# Patient Record
Sex: Female | Born: 1970 | Race: White | Hispanic: No
Health system: Western US, Academic
[De-identification: ages and names within clinical notes are randomized; demographics above are authoritative.]

## PROBLEM LIST (undated history)

## (undated) DIAGNOSIS — R112 Nausea with vomiting, unspecified: Secondary | ICD-10-CM

## (undated) DIAGNOSIS — Z9889 Other specified postprocedural states: Secondary | ICD-10-CM

## (undated) DIAGNOSIS — E039 Hypothyroidism, unspecified: Secondary | ICD-10-CM

## (undated) DIAGNOSIS — C801 Malignant (primary) neoplasm, unspecified: Secondary | ICD-10-CM

## (undated) DIAGNOSIS — O09529 Supervision of elderly multigravida, unspecified trimester: Secondary | ICD-10-CM

## (undated) DIAGNOSIS — O24419 Gestational diabetes mellitus in pregnancy, unspecified control: Secondary | ICD-10-CM

## (undated) DIAGNOSIS — C73 Malignant neoplasm of thyroid gland: Secondary | ICD-10-CM

## (undated) HISTORY — PX: PR INDUCED ABORTION DILATION AND CURETTAGE: 59840

## (undated) HISTORY — DX: Malignant neoplasm of thyroid gland: C73

## (undated) HISTORY — DX: Gestational diabetes mellitus in pregnancy, unspecified control: O24.419

## (undated) HISTORY — DX: Malignant (primary) neoplasm, unspecified: C80.1

## (undated) HISTORY — DX: Supervision of elderly multigravida, unspecified trimester: O09.529

## (undated) HISTORY — PX: THYROID SURGERY: SHX805

## (undated) MED ORDER — LEVOTHYROXINE SODIUM 112 MCG OR TABS
ORAL_TABLET | ORAL | 3 refills | Status: AC
Start: 2020-12-13 — End: ?

## (undated) MED ORDER — LEVOTHYROXINE SODIUM 112 MCG OR TABS
ORAL_TABLET | ORAL | 3 refills | Status: AC
Start: 2019-12-22 — End: ?

---

## 2008-10-13 ENCOUNTER — Ambulatory Visit (HOSPITAL_COMMUNITY): Admission: RE | Admit: 2008-10-13 | Discharge: 2008-10-13 | Payer: Self-pay | Admitting: Obstetrics and Gynecology

## 2008-11-23 ENCOUNTER — Encounter (INDEPENDENT_AMBULATORY_CARE_PROVIDER_SITE_OTHER): Payer: Self-pay | Admitting: Interventional Radiology

## 2008-11-23 ENCOUNTER — Ambulatory Visit (HOSPITAL_COMMUNITY): Admission: RE | Admit: 2008-11-23 | Discharge: 2008-11-23 | Payer: Self-pay | Admitting: Surgery

## 2009-04-18 ENCOUNTER — Inpatient Hospital Stay (HOSPITAL_COMMUNITY): Admission: AD | Admit: 2009-04-18 | Discharge: 2009-04-18 | Payer: Self-pay | Admitting: Obstetrics and Gynecology

## 2009-04-30 ENCOUNTER — Inpatient Hospital Stay (HOSPITAL_COMMUNITY): Admission: AD | Admit: 2009-04-30 | Discharge: 2009-05-03 | Payer: Self-pay | Admitting: Obstetrics and Gynecology

## 2009-08-13 HISTORY — PX: PR ANES; THYROIDECTOMY: AN60240

## 2009-12-16 ENCOUNTER — Encounter: Admission: RE | Admit: 2009-12-16 | Discharge: 2009-12-16 | Payer: Self-pay | Admitting: Endocrinology

## 2010-05-11 ENCOUNTER — Ambulatory Visit (HOSPITAL_COMMUNITY): Admission: RE | Admit: 2010-05-11 | Discharge: 2010-05-12 | Payer: Self-pay | Admitting: Surgery

## 2010-05-11 ENCOUNTER — Encounter (INDEPENDENT_AMBULATORY_CARE_PROVIDER_SITE_OTHER): Payer: Self-pay | Admitting: Surgery

## 2010-05-11 ENCOUNTER — Other Ambulatory Visit (HOSPITAL_BASED_OUTPATIENT_CLINIC_OR_DEPARTMENT_OTHER): Payer: Self-pay | Admitting: Endocrinology

## 2010-05-24 ENCOUNTER — Encounter (INDEPENDENT_AMBULATORY_CARE_PROVIDER_SITE_OTHER): Payer: Self-pay | Admitting: Surgery

## 2010-05-24 ENCOUNTER — Observation Stay (HOSPITAL_COMMUNITY): Admission: RE | Admit: 2010-05-24 | Discharge: 2010-05-25 | Payer: Self-pay | Admitting: Surgery

## 2010-06-27 ENCOUNTER — Encounter (HOSPITAL_COMMUNITY)
Admission: RE | Admit: 2010-06-27 | Discharge: 2010-09-12 | Payer: Self-pay | Source: Home / Self Care | Attending: Endocrinology | Admitting: Endocrinology

## 2010-10-24 LAB — HCG, SERUM, QUALITATIVE
Preg, Serum: NEGATIVE
Preg, Serum: NEGATIVE

## 2010-10-26 LAB — DIFFERENTIAL
Basophils Absolute: 0 10*3/uL (ref 0.0–0.1)
Eosinophils Relative: 2 % (ref 0–5)
Lymphocytes Relative: 23 % (ref 12–46)
Neutro Abs: 4.4 10*3/uL (ref 1.7–7.7)
Neutrophils Relative %: 69 % (ref 43–77)

## 2010-10-26 LAB — CBC
HCT: 38.4 % (ref 36.0–46.0)
Hemoglobin: 13.2 g/dL (ref 12.0–15.0)
MCHC: 34.4 g/dL (ref 30.0–36.0)
MCV: 90.2 fL (ref 78.0–100.0)
Platelets: 261 10*3/uL (ref 150–400)
RDW: 12.2 % (ref 11.5–15.5)
WBC: 5.5 10*3/uL (ref 4.0–10.5)
WBC: 6.4 10*3/uL (ref 4.0–10.5)

## 2010-10-26 LAB — URINALYSIS, ROUTINE W REFLEX MICROSCOPIC
Nitrite: NEGATIVE
Specific Gravity, Urine: 1.01 (ref 1.005–1.030)
Urobilinogen, UA: 1 mg/dL (ref 0.0–1.0)
pH: 6 (ref 5.0–8.0)

## 2010-10-26 LAB — CALCIUM
Calcium: 7.7 mg/dL — ABNORMAL LOW (ref 8.4–10.5)
Calcium: 7.7 mg/dL — ABNORMAL LOW (ref 8.4–10.5)

## 2010-10-26 LAB — URINE MICROSCOPIC-ADD ON

## 2010-10-26 LAB — SURGICAL PCR SCREEN: Staphylococcus aureus: POSITIVE — AB

## 2010-10-26 LAB — COMPREHENSIVE METABOLIC PANEL
ALT: 28 U/L (ref 0–35)
AST: 26 U/L (ref 0–37)
Alkaline Phosphatase: 76 U/L (ref 39–117)
CO2: 31 mEq/L (ref 19–32)
Calcium: 8.9 mg/dL (ref 8.4–10.5)
GFR calc Af Amer: >60 mL/min (ref >60)
GFR calc non Af Amer: >60 mL/min (ref >60)
Glucose, Bld: 118 mg/dL — ABNORMAL HIGH (ref 70–99)
Potassium: 3.6 mEq/L (ref 3.5–5.1)
Sodium: 139 mEq/L (ref 135–145)
Total Protein: 7.1 g/dL (ref 6.0–8.3)

## 2010-10-26 LAB — BASIC METABOLIC PANEL
BUN: 10 mg/dL (ref 6–23)
Calcium: 9.1 mg/dL (ref 8.4–10.5)
Creatinine, Ser: 0.64 mg/dL (ref 0.4–1.2)
GFR calc Af Amer: >60 mL/min (ref >60)
GFR calc non Af Amer: >60 mL/min (ref >60)

## 2010-10-26 LAB — PROTIME-INR
INR: 1.02 (ref 0.00–1.49)
Prothrombin Time: 13.6 seconds (ref 11.6–15.2)

## 2010-11-17 LAB — COMPREHENSIVE METABOLIC PANEL
BUN: 5 mg/dL — ABNORMAL LOW (ref 6–23)
Calcium: 7.9 mg/dL — ABNORMAL LOW (ref 8.4–10.5)
Creatinine, Ser: 0.67 mg/dL (ref 0.4–1.2)
Glucose, Bld: 123 mg/dL — ABNORMAL HIGH (ref 70–99)
Total Protein: 4.7 g/dL — ABNORMAL LOW (ref 6.0–8.3)

## 2010-11-17 LAB — CBC
HCT: 28.5 % — ABNORMAL LOW (ref 36.0–46.0)
HCT: 35.8 % — ABNORMAL LOW (ref 36.0–46.0)
Hemoglobin: 9.6 g/dL — ABNORMAL LOW (ref 12.0–15.0)
Hemoglobin: 9.9 g/dL — ABNORMAL LOW (ref 12.0–15.0)
MCHC: 34.7 g/dL (ref 30.0–36.0)
MCV: 95.1 fL (ref 78.0–100.0)
Platelets: 201 10*3/uL (ref 150–400)
RBC: 2.87 MIL/uL — ABNORMAL LOW (ref 3.87–5.11)
RBC: 3.76 MIL/uL — ABNORMAL LOW (ref 3.87–5.11)
RDW: 14.4 % (ref 11.5–15.5)
WBC: 9.5 10*3/uL (ref 4.0–10.5)

## 2010-11-17 LAB — URINE CULTURE
Colony Count: NO GROWTH
Special Requests: NEGATIVE

## 2010-11-17 LAB — LACTATE DEHYDROGENASE: LDH: 197 U/L (ref 94–250)

## 2010-11-17 LAB — URIC ACID: Uric Acid, Serum: 3.6 mg/dL (ref 2.4–7.0)

## 2011-07-17 ENCOUNTER — Other Ambulatory Visit (HOSPITAL_COMMUNITY): Payer: Self-pay | Admitting: Endocrinology

## 2011-07-17 DIAGNOSIS — C73 Malignant neoplasm of thyroid gland: Secondary | ICD-10-CM

## 2011-08-27 ENCOUNTER — Encounter (HOSPITAL_COMMUNITY)
Admission: RE | Admit: 2011-08-27 | Discharge: 2011-08-27 | Disposition: A | Discharge status: Home / Self Care | Payer: 59 | Source: Ambulatory Visit | Attending: Endocrinology | Admitting: Endocrinology

## 2011-08-27 DIAGNOSIS — C73 Malignant neoplasm of thyroid gland: Secondary | ICD-10-CM

## 2011-08-27 MED ORDER — THYROTROPIN ALFA 1.1 MG IM SOLR
0.9000 mg | INTRAMUSCULAR | Status: AC
  Administered 2011-08-27: 0.9 mg via INTRAMUSCULAR
  Filled 2011-08-27: qty 0.9

## 2011-08-28 ENCOUNTER — Encounter (HOSPITAL_COMMUNITY)
Admission: RE | Admit: 2011-08-28 | Discharge: 2011-08-28 | Disposition: A | Discharge status: Home / Self Care | Payer: 59 | Source: Ambulatory Visit | Attending: Endocrinology | Admitting: Endocrinology

## 2011-08-28 DIAGNOSIS — C73 Malignant neoplasm of thyroid gland: Secondary | ICD-10-CM | POA: Insufficient documentation

## 2011-08-28 MED ORDER — THYROTROPIN ALFA 1.1 MG IM SOLR
0.9000 mg | INTRAMUSCULAR | Status: AC
  Administered 2011-08-28: 0.9 mg via INTRAMUSCULAR
  Filled 2011-08-28: qty 0.9

## 2011-08-29 ENCOUNTER — Ambulatory Visit (HOSPITAL_COMMUNITY): Admission: RE | Admit: 2011-08-29 | Payer: Self-pay | Source: Ambulatory Visit

## 2011-08-29 LAB — HCG, SERUM, QUALITATIVE: Preg, Serum: NEGATIVE

## 2011-08-31 ENCOUNTER — Encounter (HOSPITAL_COMMUNITY)
Admission: RE | Admit: 2011-08-31 | Discharge: 2011-08-31 | Disposition: A | Discharge status: Home / Self Care | Payer: 59 | Source: Ambulatory Visit | Attending: Endocrinology | Admitting: Endocrinology

## 2011-08-31 MED ORDER — SODIUM IODIDE I 131 CAPSULE
4.0000 | Freq: Once | INTRAVENOUS | Status: AC | PRN
  Administered 2011-08-31: 4 via ORAL

## 2012-06-13 IMAGING — NM NM [ID] THYROID CANCER METS SP CA TX
1 series · 1 of 1 positions shown · non-contrast
Comparison: None

CLINICAL DATA: Thyroid cancer.  Status post therapy

NUCLEAR MEDICINE R-[facility] POST THERAPY WHOLE BODY SCAN
TECHNIQUE: Standard anterior and posterior localized whole body
planar images obtained 5 to 10 days following therapeutic dose of RIZILE
131.

[(id) static · 1 of 1 slices shown]
[im 1/1]
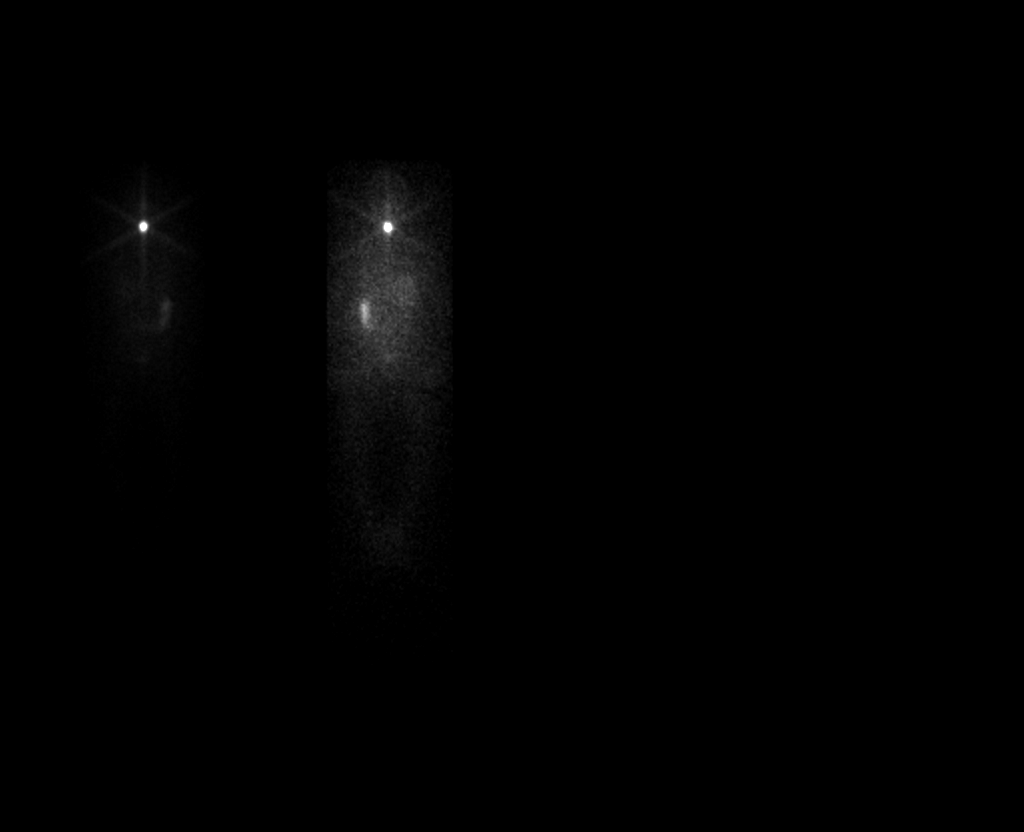

[1 of 1 positions shown; findings below may reference images not displayed]

FINDINGS: There is a medium sized focus of increased radiotracer
uptake within the thyroid bed consistent with residual functioning
thyroid tissue.  Physiologic tracer uptake is identified within the
liver and colon.  There is no abnormal focus of increased uptake to
suggest metastatic disease.
IMPRESSION: 1.  Residual functioning thyroid tissue within the thyroid bed.
2.  No evidence for metastatic disease.

## 2012-08-21 ENCOUNTER — Encounter (HOSPITAL_COMMUNITY): Payer: Self-pay | Admitting: Pharmacist

## 2012-08-21 ENCOUNTER — Encounter (HOSPITAL_COMMUNITY): Payer: Self-pay | Admitting: *Deleted

## 2012-08-22 ENCOUNTER — Ambulatory Visit (HOSPITAL_COMMUNITY): Payer: 59 | Admitting: Anesthesiology

## 2012-08-22 ENCOUNTER — Encounter (HOSPITAL_COMMUNITY): Payer: Self-pay | Admitting: Anesthesiology

## 2012-08-22 ENCOUNTER — Encounter (HOSPITAL_COMMUNITY)
Admission: RE | Disposition: A | Discharge status: Home / Self Care | Payer: Self-pay | Source: Ambulatory Visit | Attending: Obstetrics and Gynecology

## 2012-08-22 ENCOUNTER — Encounter (HOSPITAL_COMMUNITY): Payer: Self-pay | Admitting: *Deleted

## 2012-08-22 ENCOUNTER — Ambulatory Visit (HOSPITAL_COMMUNITY)
Admission: RE | Admit: 2012-08-22 | Discharge: 2012-08-22 | Disposition: A | Discharge status: Home / Self Care | Payer: 59 | Source: Ambulatory Visit | Attending: Obstetrics and Gynecology | Admitting: Obstetrics and Gynecology

## 2012-08-22 DIAGNOSIS — O034 Incomplete spontaneous abortion without complication: Secondary | ICD-10-CM | POA: Diagnosis present

## 2012-08-22 DIAGNOSIS — O021 Missed abortion: Secondary | ICD-10-CM | POA: Insufficient documentation

## 2012-08-22 DIAGNOSIS — Z9889 Other specified postprocedural states: Secondary | ICD-10-CM

## 2012-08-22 HISTORY — DX: Other specified postprocedural states: R11.2

## 2012-08-22 HISTORY — DX: Hypothyroidism, unspecified: E03.9

## 2012-08-22 HISTORY — PX: DILATION AND EVACUATION: SHX1459

## 2012-08-22 HISTORY — DX: Other specified postprocedural states: Z98.890

## 2012-08-22 LAB — CBC
HCT: 33.2 % — ABNORMAL LOW (ref 36.0–46.0)
MCH: 30.7 pg (ref 26.0–34.0)
MCV: 88.8 fL (ref 78.0–100.0)
Platelets: 244 10*3/uL (ref 150–400)
RBC: 3.74 MIL/uL — ABNORMAL LOW (ref 3.87–5.11)

## 2012-08-22 SURGERY — DILATION AND EVACUATION, UTERUS
Anesthesia: Monitor Anesthesia Care | Site: Vagina | Wound class: Clean Contaminated

## 2012-08-22 MED ORDER — DEXAMETHASONE SODIUM PHOSPHATE 10 MG/ML IJ SOLN
INTRAMUSCULAR | Status: DC | PRN
  Administered 2012-08-22: 10 mg via INTRAVENOUS

## 2012-08-22 MED ORDER — OXYCODONE-ACETAMINOPHEN 7.5-500 MG PO TABS: 1.0000 | ORAL_TABLET | ORAL | Status: DC | PRN

## 2012-08-22 MED ORDER — ONDANSETRON HCL 4 MG/2ML IJ SOLN
INTRAMUSCULAR | Status: DC | PRN
  Administered 2012-08-22: 4 mg via INTRAVENOUS

## 2012-08-22 MED ORDER — CHLOROPROCAINE HCL 1 % IJ SOLN
INTRAMUSCULAR | Status: DC | PRN
  Administered 2012-08-22: 17 mL

## 2012-08-22 MED ORDER — LIDOCAINE HCL (CARDIAC) 20 MG/ML IV SOLN
INTRAVENOUS | Status: DC | PRN
  Administered 2012-08-22: 50 mg via INTRAVENOUS

## 2012-08-22 MED ORDER — FENTANYL CITRATE 0.05 MG/ML IJ SOLN
INTRAMUSCULAR | Status: AC
  Filled 2012-08-22: qty 2

## 2012-08-22 MED ORDER — CEFAZOLIN SODIUM-DEXTROSE 2-3 GM-% IV SOLR
INTRAVENOUS | Status: AC
  Filled 2012-08-22: qty 50

## 2012-08-22 MED ORDER — CHLOROPROCAINE HCL 1 % IJ SOLN
INTRAMUSCULAR | Status: AC
  Filled 2012-08-22: qty 30

## 2012-08-22 MED ORDER — MIDAZOLAM HCL 5 MG/5ML IJ SOLN
INTRAMUSCULAR | Status: DC | PRN
  Administered 2012-08-22: 2 mg via INTRAVENOUS

## 2012-08-22 MED ORDER — KETOROLAC TROMETHAMINE 30 MG/ML IJ SOLN
INTRAMUSCULAR | Status: AC
  Filled 2012-08-22: qty 1

## 2012-08-22 MED ORDER — KETOROLAC TROMETHAMINE 30 MG/ML IJ SOLN
INTRAMUSCULAR | Status: DC | PRN
  Administered 2012-08-22: 30 mg via INTRAVENOUS

## 2012-08-22 MED ORDER — FENTANYL CITRATE 0.05 MG/ML IJ SOLN: 25.0000 ug | INTRAMUSCULAR | Status: DC | PRN

## 2012-08-22 MED ORDER — LACTATED RINGERS IV SOLN
INTRAVENOUS | Status: DC
  Administered 2012-08-22: 10:00:00 via INTRAVENOUS
  Administered 2012-08-22: 50 mL/h via INTRAVENOUS

## 2012-08-22 MED ORDER — METOCLOPRAMIDE HCL 5 MG/ML IJ SOLN: 10.0000 mg | Freq: Once | INTRAMUSCULAR | Status: DC | PRN

## 2012-08-22 MED ORDER — CEFAZOLIN SODIUM-DEXTROSE 2-3 GM-% IV SOLR
2.0000 g | INTRAVENOUS | Status: AC
  Administered 2012-08-22: 2 g via INTRAVENOUS

## 2012-08-22 MED ORDER — PROPOFOL 10 MG/ML IV EMUL
INTRAVENOUS | Status: DC | PRN
  Administered 2012-08-22 (×2): 20 mg via INTRAVENOUS

## 2012-08-22 MED ORDER — SCOPOLAMINE 1 MG/3DAYS TD PT72
MEDICATED_PATCH | TRANSDERMAL | Status: AC
  Administered 2012-08-22: 1.5 mg
  Filled 2012-08-22: qty 1

## 2012-08-22 MED ORDER — FENTANYL CITRATE 0.05 MG/ML IJ SOLN
INTRAMUSCULAR | Status: DC | PRN
  Administered 2012-08-22: 50 ug via INTRAVENOUS
  Administered 2012-08-22: 100 ug via INTRAVENOUS
  Administered 2012-08-22: 50 ug via INTRAVENOUS

## 2012-08-22 MED ORDER — ONDANSETRON HCL 4 MG/2ML IJ SOLN
INTRAMUSCULAR | Status: AC
  Filled 2012-08-22: qty 2

## 2012-08-22 MED ORDER — PROPOFOL 10 MG/ML IV EMUL
INTRAVENOUS | Status: AC
  Filled 2012-08-22: qty 20

## 2012-08-22 MED ORDER — KETOROLAC TROMETHAMINE 30 MG/ML IJ SOLN: 15.0000 mg | Freq: Once | INTRAMUSCULAR | Status: DC | PRN

## 2012-08-22 MED ORDER — MIDAZOLAM HCL 2 MG/2ML IJ SOLN
INTRAMUSCULAR | Status: AC
  Filled 2012-08-22: qty 2

## 2012-08-22 MED ORDER — LIDOCAINE HCL (CARDIAC) 20 MG/ML IV SOLN
INTRAVENOUS | Status: AC
  Filled 2012-08-22: qty 5

## 2012-08-22 SURGICAL SUPPLY — 17 items
CLOTH BEACON ORANGE TIMEOUT ST (SAFETY) ×2 IMPLANT
DECANTER SPIKE VIAL GLASS SM (MISCELLANEOUS) ×2 IMPLANT
GLOVE BIO SURGEON STRL SZ7 (GLOVE) ×2 IMPLANT
GOWN STRL REIN XL XLG (GOWN DISPOSABLE) ×4 IMPLANT
KIT BERKELEY 1ST TRIMESTER 3/8 (MISCELLANEOUS) ×2 IMPLANT
NEEDLE SPNL 20GX3.5 QUINCKE YW (NEEDLE) ×2 IMPLANT
NS IRRIG 1000ML POUR BTL (IV SOLUTION) ×2 IMPLANT
PACK VAGINAL MINOR WOMEN LF (CUSTOM PROCEDURE TRAY) ×2 IMPLANT
PAD OB MATERNITY 4.3X12.25 (PERSONAL CARE ITEMS) ×2 IMPLANT
PAD PREP 24X48 CUFFED NSTRL (MISCELLANEOUS) ×2 IMPLANT
SET BERKELEY SUCTION TUBING (SUCTIONS) ×2 IMPLANT
SYR CONTROL 10ML LL (SYRINGE) ×2 IMPLANT
TOWEL OR 17X24 6PK STRL BLUE (TOWEL DISPOSABLE) ×4 IMPLANT
VACURETTE 10 RIGID CVD (CANNULA) IMPLANT
VACURETTE 7MM CVD STRL WRAP (CANNULA) IMPLANT
VACURETTE 8 RIGID CVD (CANNULA) ×2 IMPLANT
VACURETTE 9 RIGID CVD (CANNULA) IMPLANT

## 2012-08-22 NOTE — Op Note (Signed)
NAME:  Wendy Stark, Wendy Stark NO.:  [id number]  MEDICAL RECORD NO.:  [id number]  LOCATION:  [facility]                          FACILITY:  [facility]  PHYSICIAN:  Juluis Mire, M.D.   DATE OF BIRTH:  [birth year]  DATE OF PROCEDURE: DATE OF DISCHARGE:                              OPERATIVE REPORT   PREOPERATIVE DIAGNOSIS:  Nonviable first trimester pregnancy.  POSTOPERATIVE DIAGNOSIS:  Nonviable first trimester pregnancy.  PROCEDURE:  Paracervical block, cervical dilatation with uterine evacuation.  SURGEON:  Juluis Mire, MD  ANESTHESIA:  Paracervical block and sedation.  BLOOD LOSS:  Minimal.  PACKS:  None.  DRAINS:  None.  INTRAOPERATIVE BLOOD PLACED:  None.  COMPLICATIONS:  None.  INDICATION:  Dictated in history and physical.  PROCEDURE IN DETAIL:  The patient was taken to the OR, placed supine position.  After sedation, she was placed in the dorsal lithotomy position using the Allen stirrups.  The patient was draped in sterile field.  A speculum was placed in vaginal vault.  The cervix and vagina were cleansed with Betadine.  Anterior lip of the cervix was anesthetized with 1% Xylocaine and secured with a single-tooth tenaculum.  Uterus sounded to approximately 9 cm, was posterior.  Cervix was serially dilated to a size 27 Pratt dilator.  A size 8 curved suction curette was introduced into the intrauterine cavity.  Contents were removed using suction curetting, this was done 3 times.  On the third pass, there were no additional tissue being obtained.  We did sharp curetting followed by repeat suction curetting and repeat sharp curetting.  We felt all the uterine cavity was cleared by its gritty feel.  The uterus was contracting down well at this point in time.  Bleeding was minimal.  Speculum, single-tooth tenaculum were then removed.  The patient was taken out of the dorsal lithotomy position, once alert, transferred to recovery room in good condition.   Sponge, instrument, and needle count was correct by circulating nurse x2.     Juluis Mire, M.D.     JSM/MEDQ  D:  08/22/2012  T:  08/22/2012  Job:  [id number]

## 2012-08-22 NOTE — Op Note (Signed)
Patient name  Wendy, Stark DICTATION#  [id number] CSN# [id number]  Juluis Mire, MD 08/22/2012 9:22 AM

## 2012-08-22 NOTE — H&P (Signed)
  History and physical exam unchanged 

## 2012-08-22 NOTE — Brief Op Note (Signed)
08/22/2012  9:21 AM  PATIENT:  Dione Housekeeper  42 y.o. female  PRE-OPERATIVE DIAGNOSIS:  missed ab CPT [id number]  POST-OPERATIVE DIAGNOSIS:  missed abortion  PROCEDURE:  Procedure(s) (LRB) with comments: DILATATION AND EVACUATION (N/A)  SURGEON:  Surgeon(s) and Role:    * Juluis Mire, MD - Primary  PHYSICIAN ASSISTANT:   ASSISTANTS: none   ANESTHESIA:   IV sedation and paracervical block  EBL:  Total I/O In: 500 [I.V.:500] Out: 25 [Blood:25]  BLOOD ADMINISTERED:none  DRAINS: none   LOCAL MEDICATIONS USED:  OTHER nesicaine 1 %  SPECIMEN:  Source of Specimen:  products of conception  DISPOSITION OF SPECIMEN:  PATHOLOGY  COUNTS:  YES  TOURNIQUET:  * No tourniquets in log *  DICTATION: .Other Dictation: Dictation Number [id number]  PLAN OF CARE: Discharge to home after PACU  PATIENT DISPOSITION:  PACU - hemodynamically stable.   Delay start of Pharmacological VTE agent (>24hrs) due to surgical blood loss or risk of bleeding: not applicable

## 2012-08-22 NOTE — Transfer of Care (Signed)
Immediate Anesthesia Transfer of Care Note  Patient: Wendy Stark  Procedure(s) Performed: Procedure(s) (LRB) with comments: DILATATION AND EVACUATION (N/A)  Patient Location: PACU  Anesthesia Type:General  Level of Consciousness: awake  Airway & Oxygen Therapy: Patient Spontanous Breathing  Post-op Assessment: Report given to PACU RN  Post vital signs: stable  Filed Vitals:   08/22/12 0801  BP: 111/66  Pulse: 83  Temp: 36.7 C  Resp: 18    Complications: No apparent anesthesia complications

## 2012-08-22 NOTE — H&P (Signed)
Wendy Stark is an 42 y.o. female. Presenting for dilation and evacuation for nonviable first trimester pregnancy.  Sonograms last week reveal gestation sac less than dates without fetal pole or yolk sac.  Follow up sonogram this week revealed no change and increased sac irregularity.  Findings consistent with non viable pregnancy.  Blood type  O+  Pertinent Gynecological History: Menses: regular every month without intermenstrual spotting Bleeding: normal;ception: none DES exposure: denies Blood transfusions: none Sexually transmitted diseases: no past history Previous GYN Procedures: none  Last mammogram: na Date: naast pap: normal Date: last yearry: G2 P1  Menstrual History: Menarche age: 3No LMP recorded.    Past Medical History  Diagnosis Date  . PONV (postoperative nausea and vomiting)   . Hypothyroidism     Past Surgical History  Procedure Date  . Thyroid surgery     History reviewed. No pertinent family history.  Social History:  reports that she has never smoked. She does not have any smokeless tobacco history on file. She reports that she does not drink alcohol or use illicit drugs.  Allergies: No Known Allergies  No prescriptions prior to admission    Review of Systems  All other systems reviewed and are negative.    There were no vitals taken for this visit. Physical Exam  Constitutional: She is oriented to person, place, and time. She appears well-developed and well-nourished.  HENT:  Head: Normocephalic and atraumatic.  Eyes: Pupils are equal, round, and reactive to light.  Cardiovascular: Normal rate, regular rhythm and normal heart sounds.   Respiratory: Effort normal and breath sounds normal.  GI: Soft. Bowel sounds are normal.  Genitourinary:       Uterus 8 weeks is size adnexa clear   Neurological: She is alert and oriented to person, place, and time. She has normal reflexes.    No results found for this or any previous visit (from the past  24 hour(s)).  No results found.  Assessment/Plan: Nonviable first trimester pregnancy Precede with dilation and evacuation.  Risks discussed and alternatives explained.  Risks include.  Infection.  Hemorrhage with possible need for transfusions with associate risk of aids and hepatitis.  Excessive bleeding could require hysterectomy.  Risk of retained products that could require repeat procedure. Risk of perforation with injury to adjacent organs requiring exploratory surgery.  Risk of DVT and PE.  Alternative would be following to see if she would abort on her own.  Patient understands nature of the procedure, risks and alternatives   Wendy Stark S 08/22/2012, 6:57 AM

## 2012-08-22 NOTE — Anesthesia Postprocedure Evaluation (Signed)
Anesthesia Post Note  Patient: Wendy Stark  Procedure(s) Performed: Procedure(s) (LRB): DILATATION AND EVACUATION (N/A)  Anesthesia type: MAC  Patient location: PACU  Post pain: Pain level controlled  Post assessment: Post-op Vital signs reviewed  Last Vitals:  Filed Vitals:   08/22/12 1015  BP: 110/61  Pulse: 69  Temp:   Resp: 20    Post vital signs: Reviewed  Level of consciousness: sedated  Complications: No apparent anesthesia complications

## 2012-08-22 NOTE — Anesthesia Preprocedure Evaluation (Signed)
Anesthesia Evaluation  Patient identified by MRN, date of birth, ID band Patient awake    Reviewed: Allergy & Precautions, H&P , NPO status , Patient's Chart, lab work & pertinent test results, reviewed documented beta blocker date and time   History of Anesthesia Complications (+) PONV  Airway Mallampati: II TM Distance: >3 FB Neck ROM: full    Dental  (+) Teeth Intact   Pulmonary neg pulmonary ROS,  breath sounds clear to auscultation        Cardiovascular negative cardio ROS  Rhythm:regular Rate:Normal     Neuro/Psych negative neurological ROS  negative psych ROS   GI/Hepatic negative GI ROS, Neg liver ROS,   Endo/Other  Hypothyroidism (s/p thyroidectomy)   Renal/GU negative Renal ROS  negative genitourinary   Musculoskeletal   Abdominal   Peds  Hematology negative hematology ROS (+)   Anesthesia Other Findings   Reproductive/Obstetrics (+) Pregnancy (missed ab)                           Anesthesia Physical Anesthesia Plan  ASA: II  Anesthesia Plan: MAC   Post-op Pain Management:    Induction:   Airway Management Planned:   Additional Equipment:   Intra-op Plan:   Post-operative Plan:   Informed Consent: I have reviewed the patients History and Physical, chart, labs and discussed the procedure including the risks, benefits and alternatives for the proposed anesthesia with the patient or authorized representative who has indicated his/her understanding and acceptance.   Dental Advisory Given  Plan Discussed with: Surgeon and CRNA  Anesthesia Plan Comments:         Anesthesia Quick Evaluation

## 2012-08-25 ENCOUNTER — Encounter (HOSPITAL_COMMUNITY): Payer: Self-pay | Admitting: Obstetrics and Gynecology

## 2013-03-20 LAB — OB RESULTS CONSOLE GC/CHLAMYDIA
CHLAMYDIA, DNA PROBE: NEGATIVE
GC PROBE AMP, GENITAL: NEGATIVE

## 2013-03-20 LAB — OB RESULTS CONSOLE RUBELLA ANTIBODY, IGM: RUBELLA: IMMUNE

## 2013-03-20 LAB — OB RESULTS CONSOLE HEPATITIS B SURFACE ANTIGEN: Hepatitis B Surface Ag: NEGATIVE

## 2013-03-20 LAB — OB RESULTS CONSOLE ABO/RH: RH Type: POSITIVE

## 2013-03-20 LAB — OB RESULTS CONSOLE RPR: RPR: NONREACTIVE

## 2013-03-20 LAB — OB RESULTS CONSOLE ANTIBODY SCREEN: ANTIBODY SCREEN: NEGATIVE

## 2013-03-20 LAB — OB RESULTS CONSOLE HIV ANTIBODY (ROUTINE TESTING): HIV: NONREACTIVE

## 2013-05-28 ENCOUNTER — Other Ambulatory Visit (HOSPITAL_COMMUNITY): Payer: Self-pay | Admitting: Internal Medicine

## 2013-05-28 DIAGNOSIS — R1011 Right upper quadrant pain: Secondary | ICD-10-CM

## 2013-06-01 ENCOUNTER — Ambulatory Visit (HOSPITAL_COMMUNITY)
Admission: RE | Admit: 2013-06-01 | Discharge: 2013-06-01 | Disposition: A | Discharge status: Home / Self Care | Payer: 59 | Source: Ambulatory Visit | Attending: Internal Medicine | Admitting: Internal Medicine

## 2013-06-01 DIAGNOSIS — R1011 Right upper quadrant pain: Secondary | ICD-10-CM | POA: Insufficient documentation

## 2013-06-01 DIAGNOSIS — O99891 Other specified diseases and conditions complicating pregnancy: Secondary | ICD-10-CM | POA: Insufficient documentation

## 2013-08-13 NOTE — L&D Delivery Note (Signed)
Delivery Note At 11:37 AM a viable female was delivered via Vaginal, Spontaneous Delivery (Presentation: Left Occiput Anterior).  APGAR:8/9 , ; weight .pending   Placenta status:intact , .3 vessel  Cord:  with the following complications:none .  Cord pH: na  Anesthesia: Local  Episiotomy: none Lacerations:teft labial  Suture Repair: 2.0 chromic Est. Blood Loss (mL):   Mom to postpartum.  Baby to Couplet care / Skin to Skin.  Ridhima Golberg S 10/20/2013, 11:54 AM

## 2013-08-19 ENCOUNTER — Encounter: Payer: 59 | Attending: Obstetrics and Gynecology

## 2013-08-19 VITALS — Ht 63.0 in | Wt 124.0 lb

## 2013-08-19 DIAGNOSIS — O99810 Abnormal glucose complicating pregnancy: Secondary | ICD-10-CM | POA: Insufficient documentation

## 2013-08-19 DIAGNOSIS — Z713 Dietary counseling and surveillance: Secondary | ICD-10-CM | POA: Insufficient documentation

## 2013-08-19 NOTE — Progress Notes (Signed)
  Patient was seen on 08/19/13 for Gestational Diabetes self-management class at the [facility]. The following learning objectives were met by the patient during this course:   States the definition of Gestational Diabetes  States why dietary management is important in controlling blood glucose  Describes the effects of carbohydrates on blood glucose levels  Demonstrates ability to create a balanced meal plan  Demonstrates carbohydrate counting   States when to check blood glucose levels  Demonstrates proper blood glucose monitoring techniques  States the effect of stress and exercise on blood glucose levels  States the importance of limiting caffeine and abstaining from alcohol and smoking  Plan:  Aim for 2 Carb Choices per meal (30 grams) +/- 1 either way for breakfast Aim for 3 Carb Choices per meal (45 grams) +/- 1 either way from lunch and dinner Aim for 1-2 Carbs per snack Begin reading food labels for Total Carbohydrate and sugar grams of foods Consider  increasing your activity level by walking daily as tolerated Begin checking BG before breakfast and 1-2 hours after first bit of breakfast, lunch and dinner after  as directed by MD  Take medication  as directed by MD  Blood glucose monitor given: One Touch Ultra Mini Lot # [id number] X Exp: 01/2014 Blood glucose reading: 139m/dl  Patient instructed to monitor glucose levels: FBS: 60 - <90 1 hour: <140 2 hour: <120  Patient received the following handouts:  Nutrition Diabetes and Pregnancy  Carbohydrate Counting List  Meal Planning worksheet  Patient will be seen for follow-up as needed.

## 2013-10-09 ENCOUNTER — Encounter (HOSPITAL_COMMUNITY): Payer: Self-pay | Admitting: *Deleted

## 2013-10-09 ENCOUNTER — Telehealth (HOSPITAL_COMMUNITY): Payer: Self-pay | Admitting: *Deleted

## 2013-10-09 LAB — OB RESULTS CONSOLE GBS: STREP GROUP B AG: NEGATIVE

## 2013-10-09 NOTE — Telephone Encounter (Signed)
Preadmission screen  

## 2013-10-20 ENCOUNTER — Encounter (HOSPITAL_COMMUNITY): Payer: Self-pay

## 2013-10-20 ENCOUNTER — Inpatient Hospital Stay (HOSPITAL_COMMUNITY)
Admission: RE | Admit: 2013-10-20 | Discharge: 2013-10-22 | DRG: 775 | Disposition: A | Discharge status: Home / Self Care | Payer: 59 | Source: Ambulatory Visit | Attending: Obstetrics and Gynecology | Admitting: Obstetrics and Gynecology

## 2013-10-20 DIAGNOSIS — O24419 Gestational diabetes mellitus in pregnancy, unspecified control: Secondary | ICD-10-CM | POA: Diagnosis present

## 2013-10-20 DIAGNOSIS — O99284 Endocrine, nutritional and metabolic diseases complicating childbirth: Secondary | ICD-10-CM

## 2013-10-20 DIAGNOSIS — E039 Hypothyroidism, unspecified: Secondary | ICD-10-CM | POA: Diagnosis present

## 2013-10-20 DIAGNOSIS — O99814 Abnormal glucose complicating childbirth: Secondary | ICD-10-CM | POA: Diagnosis present

## 2013-10-20 DIAGNOSIS — O09529 Supervision of elderly multigravida, unspecified trimester: Secondary | ICD-10-CM | POA: Diagnosis present

## 2013-10-20 DIAGNOSIS — E079 Disorder of thyroid, unspecified: Secondary | ICD-10-CM | POA: Diagnosis present

## 2013-10-20 LAB — CBC
HCT: 35.7 % — ABNORMAL LOW (ref 36.0–46.0)
Hemoglobin: 12.3 g/dL (ref 12.0–15.0)
MCH: 30.8 pg (ref 26.0–34.0)
MCHC: 34.5 g/dL (ref 30.0–36.0)
MCV: 89.3 fL (ref 78.0–100.0)
PLATELETS: 177 10*3/uL (ref 150–400)
RBC: 4 MIL/uL (ref 3.87–5.11)
RDW: 14.7 % (ref 11.5–15.5)
WBC: 6.6 10*3/uL (ref 4.0–10.5)

## 2013-10-20 LAB — TYPE AND SCREEN
ABO/RH(D): O POS
Antibody Screen: NEGATIVE

## 2013-10-20 LAB — RPR: RPR Ser Ql: NONREACTIVE

## 2013-10-20 LAB — ABO/RH: ABO/RH(D): O POS

## 2013-10-20 MED ORDER — LEVOTHYROXINE SODIUM 112 MCG PO TABS
112.0000 ug | ORAL_TABLET | Freq: Every day | ORAL | Status: DC
  Administered 2013-10-22: 112 ug via ORAL
  Filled 2013-10-20 (×3): qty 1

## 2013-10-20 MED ORDER — LACTATED RINGERS IV SOLN
INTRAVENOUS | Status: DC
  Administered 2013-10-20: 09:00:00 via INTRAVENOUS

## 2013-10-20 MED ORDER — ZOLPIDEM TARTRATE 5 MG PO TABS: 5.0000 mg | ORAL_TABLET | Freq: Every evening | ORAL | Status: DC | PRN

## 2013-10-20 MED ORDER — TETANUS-DIPHTH-ACELL PERTUSSIS 5-2.5-18.5 LF-MCG/0.5 IM SUSP: 0.5000 mL | Freq: Once | INTRAMUSCULAR | Status: DC

## 2013-10-20 MED ORDER — OXYCODONE-ACETAMINOPHEN 5-325 MG PO TABS
1.0000 | ORAL_TABLET | ORAL | Status: DC | PRN
  Filled 2013-10-20: qty 2

## 2013-10-20 MED ORDER — TERBUTALINE SULFATE 1 MG/ML IJ SOLN: 0.2500 mg | Freq: Once | INTRAMUSCULAR | Status: DC | PRN

## 2013-10-20 MED ORDER — LIDOCAINE HCL (PF) 1 % IJ SOLN
30.0000 mL | INTRAMUSCULAR | Status: DC | PRN
  Administered 2013-10-20: 30 mL via SUBCUTANEOUS
  Filled 2013-10-20: qty 30

## 2013-10-20 MED ORDER — ONDANSETRON HCL 4 MG PO TABS: 4.0000 mg | ORAL_TABLET | ORAL | Status: DC | PRN

## 2013-10-20 MED ORDER — SIMETHICONE 80 MG PO CHEW: 80.0000 mg | CHEWABLE_TABLET | ORAL | Status: DC | PRN

## 2013-10-20 MED ORDER — SENNOSIDES-DOCUSATE SODIUM 8.6-50 MG PO TABS
2.0000 | ORAL_TABLET | ORAL | Status: DC
  Administered 2013-10-21 (×2): 2 via ORAL
  Filled 2013-10-20 (×2): qty 2

## 2013-10-20 MED ORDER — DIPHENHYDRAMINE HCL 25 MG PO CAPS: 25.0000 mg | ORAL_CAPSULE | Freq: Four times a day (QID) | ORAL | Status: DC | PRN

## 2013-10-20 MED ORDER — LANOLIN HYDROUS EX OINT: TOPICAL_OINTMENT | CUTANEOUS | Status: DC | PRN

## 2013-10-20 MED ORDER — CITRIC ACID-SODIUM CITRATE 334-500 MG/5ML PO SOLN: 30.0000 mL | ORAL | Status: DC | PRN

## 2013-10-20 MED ORDER — IBUPROFEN 600 MG PO TABS
600.0000 mg | ORAL_TABLET | Freq: Four times a day (QID) | ORAL | Status: DC
  Administered 2013-10-20 – 2013-10-22 (×8): 600 mg via ORAL
  Filled 2013-10-20 (×8): qty 1

## 2013-10-20 MED ORDER — ONDANSETRON HCL 4 MG/2ML IJ SOLN: 4.0000 mg | INTRAMUSCULAR | Status: DC | PRN

## 2013-10-20 MED ORDER — DIBUCAINE 1 % RE OINT
1.0000 "application " | TOPICAL_OINTMENT | RECTAL | Status: DC | PRN
  Filled 2013-10-20: qty 28

## 2013-10-20 MED ORDER — BENZOCAINE-MENTHOL 20-0.5 % EX AERO
1.0000 "application " | INHALATION_SPRAY | CUTANEOUS | Status: DC | PRN
  Filled 2013-10-20: qty 56

## 2013-10-20 MED ORDER — OXYTOCIN 40 UNITS IN LACTATED RINGERS INFUSION - SIMPLE MED
62.5000 mL/h | INTRAVENOUS | Status: DC
  Administered 2013-10-20: 62.5 mL/h via INTRAVENOUS
  Filled 2013-10-20: qty 1000

## 2013-10-20 MED ORDER — FLEET ENEMA 7-19 GM/118ML RE ENEM: 1.0000 | ENEMA | RECTAL | Status: DC | PRN

## 2013-10-20 MED ORDER — OXYTOCIN BOLUS FROM INFUSION
500.0000 mL | INTRAVENOUS | Status: DC
  Administered 2013-10-20: 500 mL via INTRAVENOUS

## 2013-10-20 MED ORDER — IBUPROFEN 600 MG PO TABS: 600.0000 mg | ORAL_TABLET | Freq: Four times a day (QID) | ORAL | Status: DC | PRN

## 2013-10-20 MED ORDER — OXYTOCIN 40 UNITS IN LACTATED RINGERS INFUSION - SIMPLE MED: 1.0000 m[IU]/min | INTRAVENOUS | Status: DC

## 2013-10-20 MED ORDER — WITCH HAZEL-GLYCERIN EX PADS: 1.0000 "application " | MEDICATED_PAD | CUTANEOUS | Status: DC | PRN

## 2013-10-20 MED ORDER — LACTATED RINGERS IV SOLN: 500.0000 mL | INTRAVENOUS | Status: DC | PRN

## 2013-10-20 MED ORDER — OXYCODONE-ACETAMINOPHEN 5-325 MG PO TABS
1.0000 | ORAL_TABLET | ORAL | Status: DC | PRN
  Administered 2013-10-20 – 2013-10-21 (×5): 2 via ORAL
  Filled 2013-10-20 (×4): qty 2

## 2013-10-20 MED ORDER — BISACODYL 10 MG RE SUPP: 10.0000 mg | Freq: Every day | RECTAL | Status: DC | PRN

## 2013-10-20 MED ORDER — ACETAMINOPHEN 325 MG PO TABS: 650.0000 mg | ORAL_TABLET | ORAL | Status: DC | PRN

## 2013-10-20 MED ORDER — FLEET ENEMA 7-19 GM/118ML RE ENEM: 1.0000 | ENEMA | Freq: Every day | RECTAL | Status: DC | PRN

## 2013-10-20 MED ORDER — ONDANSETRON HCL 4 MG/2ML IJ SOLN: 4.0000 mg | Freq: Four times a day (QID) | INTRAMUSCULAR | Status: DC | PRN

## 2013-10-20 MED ORDER — PRENATAL MULTIVITAMIN CH
1.0000 | ORAL_TABLET | Freq: Every day | ORAL | Status: DC
  Administered 2013-10-21: 1 via ORAL
  Filled 2013-10-20: qty 1

## 2013-10-20 NOTE — H&P (Signed)
Wendy Stark is a 43 y.o. female presenting at 26.3 for induction .  PNC complicated by ama normal cell free fetal DNA.  Gestational diabetes requiring oral meds.   Negative GBS. Maternal Medical History:  Reason for admission: Induction   Contractions: Frequency: irregular.   Perceived severity is mild.    Fetal activity: Perceived fetal activity is normal.    Prenatal complications: Gestational diabetes requiring glyberide for management  Prenatal Complications - Diabetes: gestational. Diabetes is managed by oral agent (monotherapy).      OB History   Grav Para Term Preterm Abortions TAB SAB Ect Mult Living   3 1 1  1  1   1      Past Medical History  Diagnosis Date  . PONV (postoperative nausea and vomiting)   . Hypothyroidism   . AMA (advanced maternal age) multigravida 31+   . Cancer     papillary carcinoma of thyroid   Past Surgical History  Procedure Laterality Date  . Thyroid surgery    . Dilation and evacuation  08/22/2012    Procedure: DILATATION AND EVACUATION;  Surgeon: Darlyn Chamber, MD;  Location: [facility];  Service: Gynecology;  Laterality: N/A;   Family History: family history includes Alzheimer's disease in her maternal grandmother. Social History:  reports that she has never smoked. She does not have any smokeless tobacco history on file. She reports that she does not drink alcohol or use illicit drugs.   Prenatal Transfer Tool  Maternal Diabetes: Yes:  Diabetes Type:  Insulin/Medication controlled Genetic Screening: Normal Maternal Ultrasounds/Referrals: Normal Fetal Ultrasounds or other Referrals:  None Maternal Substance Abuse:  No Significant Maternal Medications:  glyberide Significant Maternal Lab Results:  None Other Comments:  None  ROS    Height 5\' 3"  (1.6 m), weight 58.514 kg (129 lb), last menstrual period 01/17/2013. Maternal Exam:  Uterine Assessment: Contraction strength is mild.  Contraction frequency is irregular.   Abdomen:  Patient reports no abdominal tenderness. Fundal height is c/wdates.   Estimated fetal weight is 7.   Fetal presentation: vertex  Introitus: Amniotic fluid character: clear.  Cervix: Cervix 3-4 cm   50 %  vtx at -1  Fetal Exam Fetal State Assessment: Category I - tracings are normal.     Physical Exam  Prenatal labs: ABO, Rh: O/Positive/-- (08/08 0000) Antibody: Negative (08/08 0000) Rubella: Immune (08/08 0000) RPR: Nonreactive (08/08 0000)  HBsAg: Negative (08/08 0000)  HIV: Non-reactive (08/08 0000)  GBS: Negative (02/27 0000)   Assessment/Plan: IUP at 39.3 with gestational diabets requiring oral medication AMA Induction risk of pitocin discussed   Akili Corsetti S 10/20/2013, 8:42 AM

## 2013-10-20 NOTE — Lactation Note (Signed)
This note was copied from the chart of [place]. Lactation Consultation Note Initial visit at 9 hours of age.  Mom reports a few feedings, but baby doesn't stay on long.  Admission nursery continues to monitor low blood sugars and baby was previously supplemented with formula and bottle.  Mom is able to demonstrate hand expression with colostrum visible.  Encouraged mom to feed with cues, STS and discussed cluster feeding.  Mom denies pain.  [facility] LC resources given and discussed.  Mom to call for assist as needed.    Patient Name: Wendy Stark [id number] Date: 10/20/2013 Reason for consult: Initial assessment   Maternal Data Has patient been taught Hand Expression?: Yes  Feeding Feeding Type: Breast Fed Length of feed: 5 min  LATCH Score/Interventions Latch: Grasps breast easily, tongue down, lips flanged, rhythmical sucking.  Audible Swallowing: None Intervention(s): Skin to skin  Type of Nipple: Everted at rest and after stimulation  Comfort (Breast/Nipple): Soft / non-tender     Hold (Positioning): No assistance needed to correctly position infant at breast. Intervention(s): Breastfeeding basics reviewed  LATCH Score: 8  Lactation Tools Discussed/Used     Consult Status Consult Status: Follow-up Date: 10/21/13 Follow-up type: In-patient    Justice Britain 10/20/2013, 9:01 PM

## 2013-10-21 LAB — CBC
HCT: 28 % — ABNORMAL LOW (ref 36.0–46.0)
Hemoglobin: 9.8 g/dL — ABNORMAL LOW (ref 12.0–15.0)
MCH: 31.4 pg (ref 26.0–34.0)
MCHC: 35 g/dL (ref 30.0–36.0)
MCV: 89.7 fL (ref 78.0–100.0)
Platelets: 155 10*3/uL (ref 150–400)
RBC: 3.12 MIL/uL — AB (ref 3.87–5.11)
RDW: 14.9 % (ref 11.5–15.5)
WBC: 9.2 10*3/uL (ref 4.0–10.5)

## 2013-10-21 NOTE — Progress Notes (Signed)
This nurse went in to give pt her synthroid medication and pt told nurse she has just taken her medication she had brought from home.  Pt instructed not to take home medication, that her doctor had ordered the medication for her and that we would administer it.  Pt verbalized understanding.  Juanda Chance NP was notified.

## 2013-10-21 NOTE — Progress Notes (Signed)
Post Partum Day 1 Subjective: up ad lib, voiding and tolerating PO  Objective: Blood pressure 107/67, pulse 73, temperature 98.2 F (36.8 C), temperature source Oral, resp. rate 18, height 5\' 3"  (1.6 m), weight 129 lb (58.514 kg), last menstrual period 01/17/2013, unknown if currently breastfeeding.  Physical Exam:  General: alert and cooperative Lochia: appropriate Uterine Fundus: firm Incision: labial edema noted , able to void without difficulty DVT Evaluation: No evidence of DVT seen on physical exam. Negative Homan's sign. No cords or calf tenderness. No significant calf/ankle edema.   Recent Labs  10/20/13 0740 10/21/13 0610  HGB 12.3 9.8*  HCT 35.7* 28.0*    Assessment/Plan: Plan for discharge tomorrow   LOS: 1 day   Wendy Stark G 10/21/2013, 8:10 AM

## 2013-10-22 MED ORDER — OXYCODONE-ACETAMINOPHEN 5-325 MG PO TABS: 1.0000 | ORAL_TABLET | ORAL | Status: AC | PRN

## 2013-10-22 MED ORDER — IBUPROFEN 600 MG PO TABS
600.0000 mg | ORAL_TABLET | Freq: Four times a day (QID) | ORAL | Status: AC
Start: 2013-10-22 — End: ?

## 2013-10-22 NOTE — Discharge Summary (Signed)
Obstetric Discharge Summary Reason for Admission: induction of labor Prenatal Procedures: ultrasound Intrapartum Procedures: spontaneous vaginal delivery Postpartum Procedures: none Complications-Operative and Postpartum: 1 degree perineal laceration Hemoglobin  Date Value Ref Range Status  10/21/2013 9.8* 12.0 - 15.0 g/dL Final     DELTA CHECK NOTED     REPEATED TO VERIFY     HCT  Date Value Ref Range Status  10/21/2013 28.0* 36.0 - 46.0 % Final    Physical Exam:  General: alert and cooperative Lochia: appropriate Uterine Fundus: firm Incision: healing well DVT Evaluation: No evidence of DVT seen on physical exam. Negative Homan's sign. No cords or calf tenderness. No significant calf/ankle edema.  Discharge Diagnoses: Term Pregnancy-delivered  Discharge Information: Date: 10/22/2013 Activity: pelvic rest Diet: routine Medications: PNV, Ibuprofen and Percocet Condition: stable Instructions: refer to practice specific booklet Discharge to: home   Newborn Data: Live born female  Birth Weight: 6 lb 14 oz (3118 g) APGAR: 9, 9  Home with mother.  Wendy Stark G 10/22/2013, 8:13 AM

## 2013-10-24 ENCOUNTER — Inpatient Hospital Stay (HOSPITAL_COMMUNITY): Admission: AD | Admit: 2013-10-24 | Payer: 59 | Source: Ambulatory Visit | Admitting: Obstetrics and Gynecology

## 2014-04-26 ENCOUNTER — Encounter (HOSPITAL_COMMUNITY)
Admission: RE | Admit: 2014-04-26 | Discharge: 2014-04-26 | Disposition: A | Discharge status: Home / Self Care | Payer: 59 | Source: Ambulatory Visit | Attending: Endocrinology | Admitting: Endocrinology

## 2014-04-26 ENCOUNTER — Other Ambulatory Visit (HOSPITAL_COMMUNITY): Payer: Self-pay | Admitting: Endocrinology

## 2014-04-26 DIAGNOSIS — C73 Malignant neoplasm of thyroid gland: Secondary | ICD-10-CM | POA: Diagnosis not present

## 2014-04-26 MED ORDER — THYROTROPIN ALFA 1.1 MG IM SOLR
0.9000 mg | INTRAMUSCULAR | Status: AC
  Administered 2014-04-26: 0.9 mg via INTRAMUSCULAR

## 2014-04-27 ENCOUNTER — Encounter (HOSPITAL_COMMUNITY)
Admission: RE | Admit: 2014-04-27 | Discharge: 2014-04-27 | Disposition: A | Discharge status: Home / Self Care | Payer: 59 | Source: Ambulatory Visit | Attending: Endocrinology | Admitting: Endocrinology

## 2014-04-27 DIAGNOSIS — C73 Malignant neoplasm of thyroid gland: Secondary | ICD-10-CM | POA: Diagnosis not present

## 2014-04-27 MED ORDER — THYROTROPIN ALFA 1.1 MG IM SOLR
0.9000 mg | INTRAMUSCULAR | Status: AC
  Administered 2014-04-27: 0.9 mg via INTRAMUSCULAR

## 2014-04-27 MED ORDER — THYROTROPIN ALFA 1.1 MG IM SOLR: 0.9000 mg | INTRAMUSCULAR | Status: AC

## 2014-04-28 ENCOUNTER — Encounter (HOSPITAL_COMMUNITY): Admission: RE | Admit: 2014-04-28 | Payer: 59 | Source: Ambulatory Visit

## 2014-04-28 DIAGNOSIS — C73 Malignant neoplasm of thyroid gland: Secondary | ICD-10-CM | POA: Diagnosis not present

## 2014-04-28 LAB — HCG, SERUM, QUALITATIVE: PREG SERUM: NEGATIVE

## 2014-04-30 ENCOUNTER — Encounter (HOSPITAL_COMMUNITY)
Admission: RE | Admit: 2014-04-30 | Discharge: 2014-04-30 | Disposition: A | Discharge status: Home / Self Care | Payer: 59 | Source: Ambulatory Visit | Attending: Endocrinology | Admitting: Endocrinology

## 2014-04-30 DIAGNOSIS — C73 Malignant neoplasm of thyroid gland: Secondary | ICD-10-CM | POA: Diagnosis not present

## 2014-04-30 MED ORDER — SODIUM IODIDE I 131 CAPSULE
4.0000 | Freq: Once | INTRAVENOUS | Status: AC | PRN
  Administered 2014-04-29: 4 via ORAL

## 2014-06-14 ENCOUNTER — Encounter (HOSPITAL_COMMUNITY): Payer: Self-pay

## 2015-05-06 IMAGING — US US ABDOMEN COMPLETE
1 series · 14 of 25 positions shown · non-contrast
Comparison: None.

CLINICAL DATA: Right upper quadrant pain.

EXAM:
ULTRASOUND ABDOMEN COMPLETE

[Series 1: us abdomen complete · 14 of 90 slices shown]
[im 1/90]
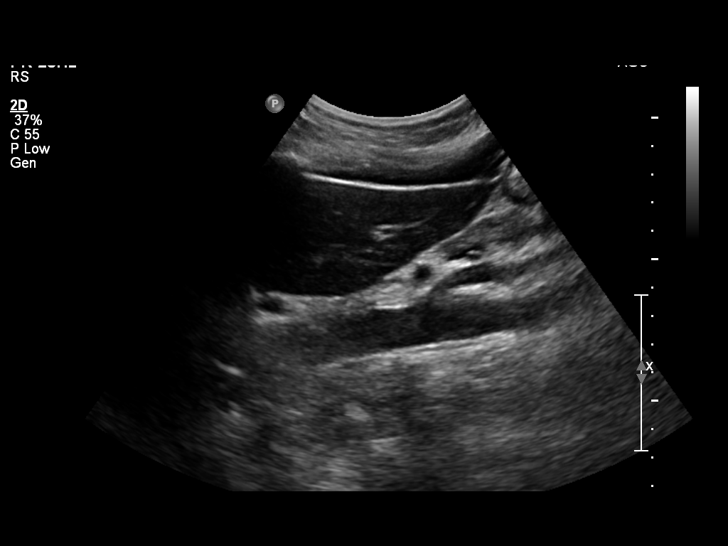
[im 8/90]
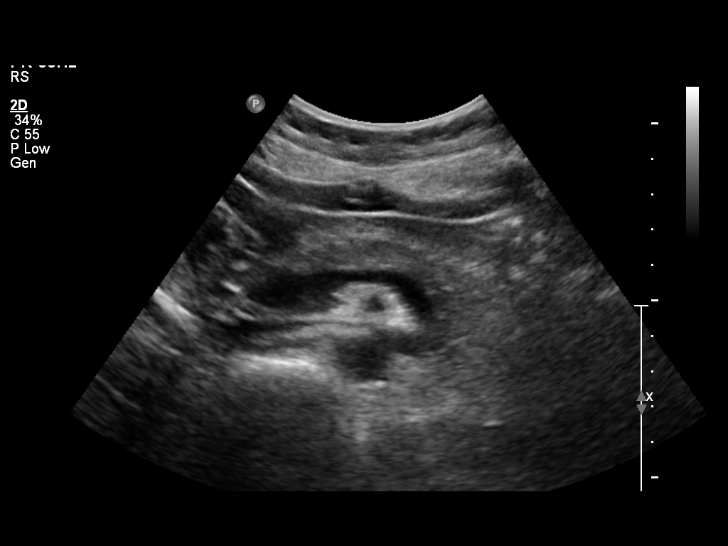
[im 15/90]
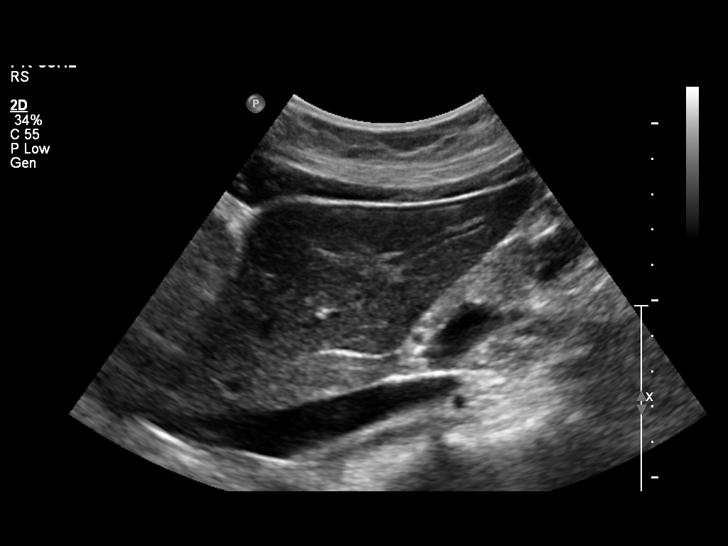
[im 23/90]
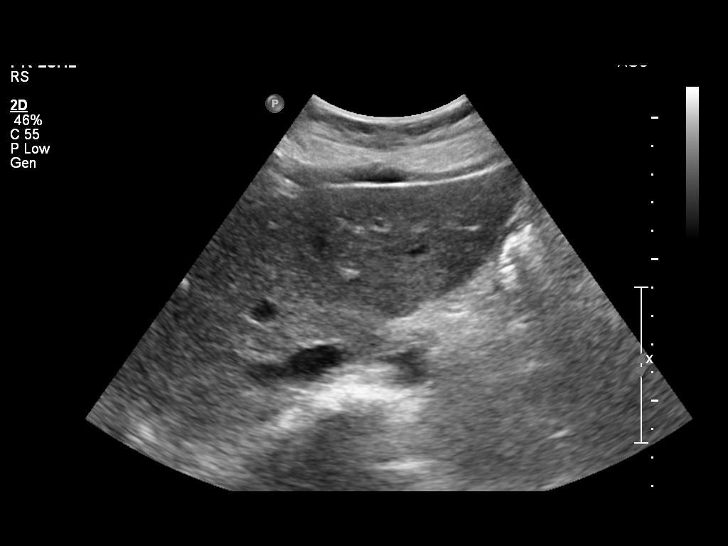
[im 30/90]
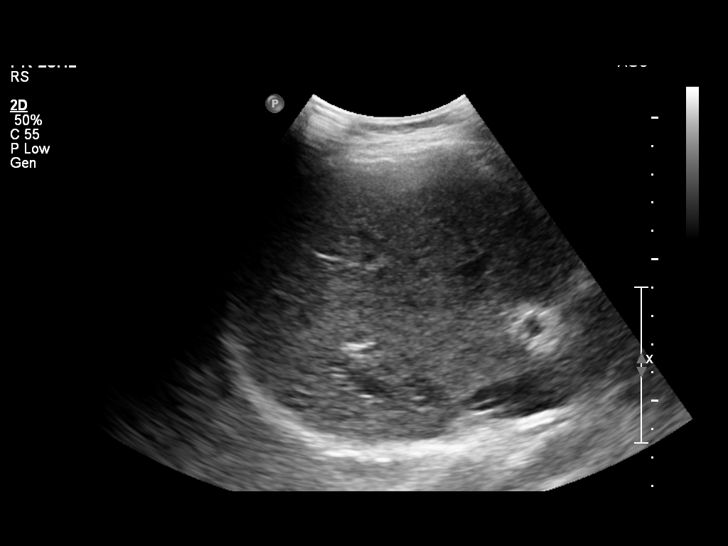
[im 34/90]
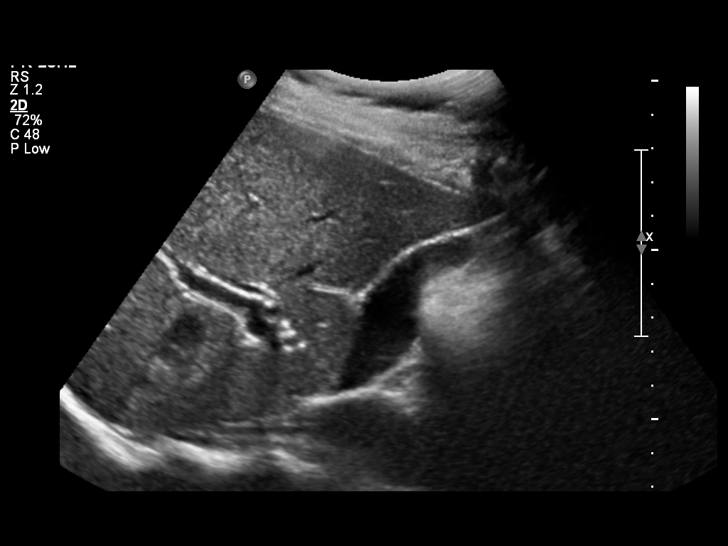
[im 41/90]
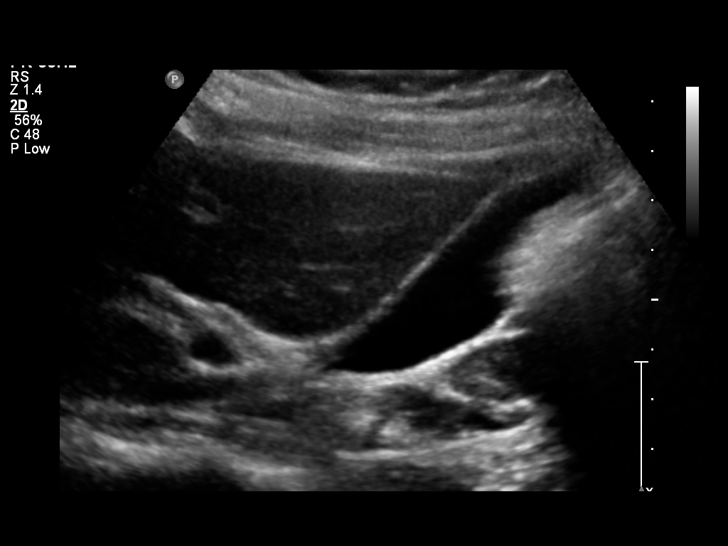
[im 49/90]
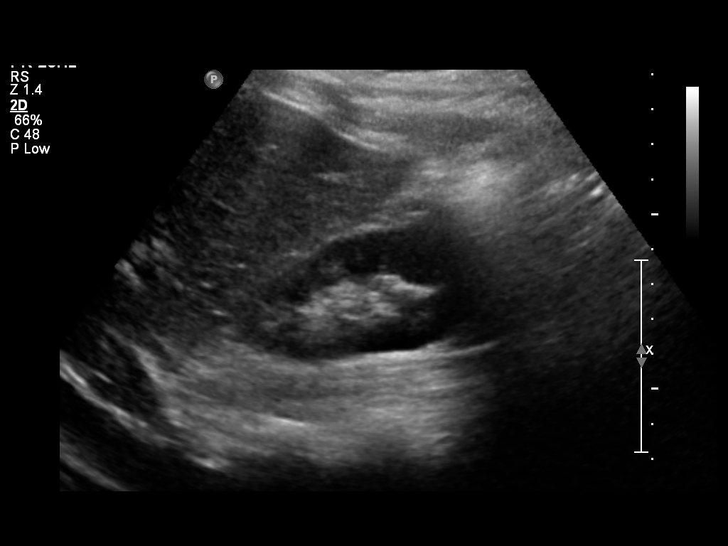
[im 56/90]
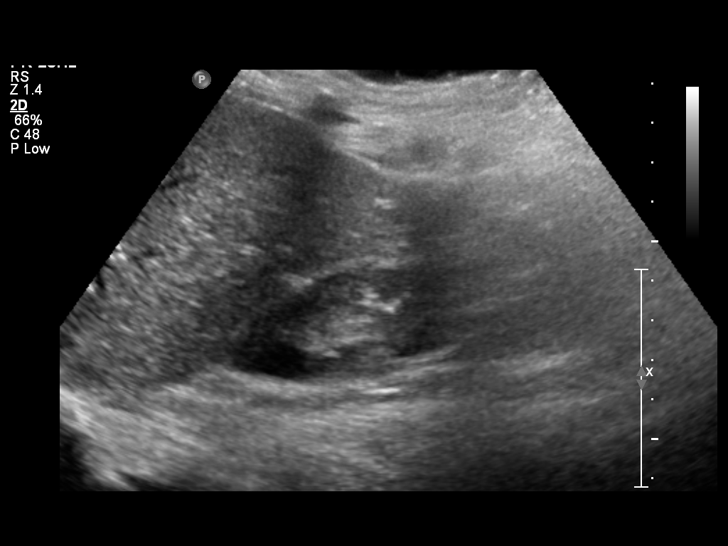
[im 60/90]
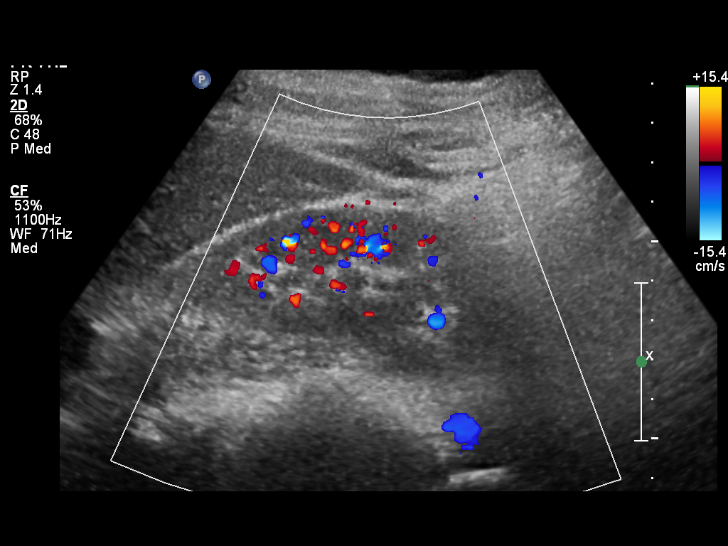
[im 67/90]
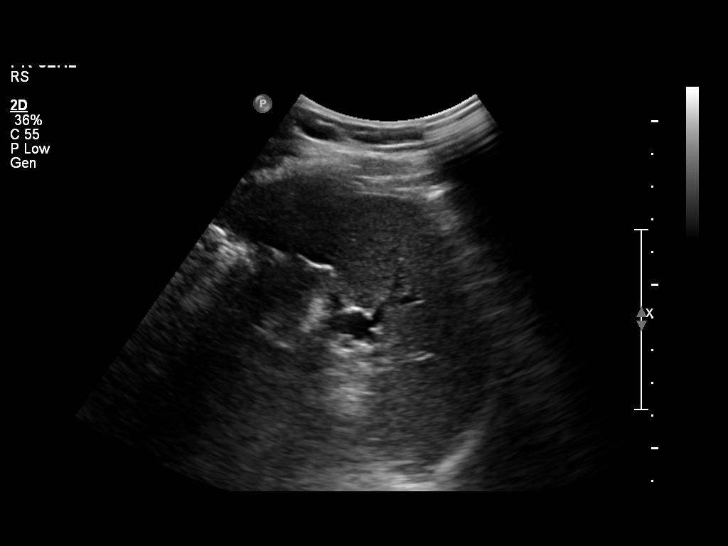
[im 75/90]
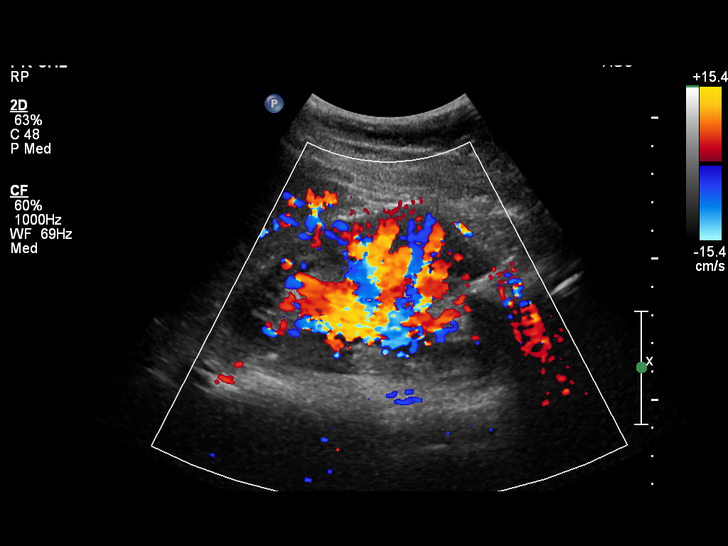
[im 82/90]
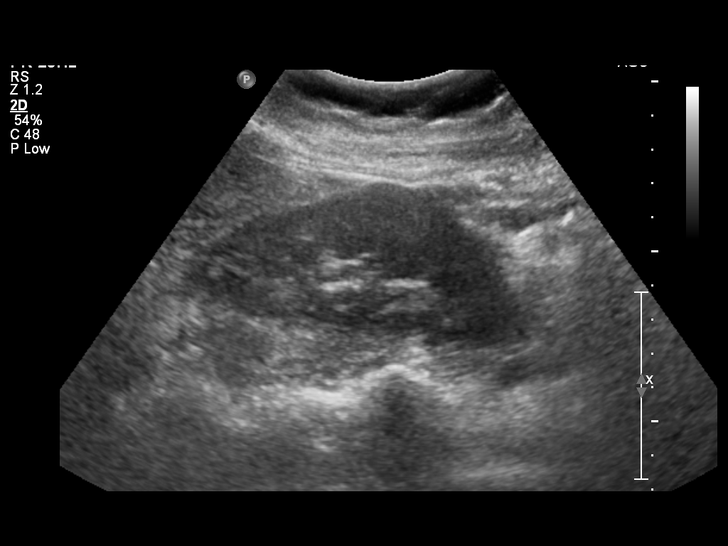
[im 90/90]
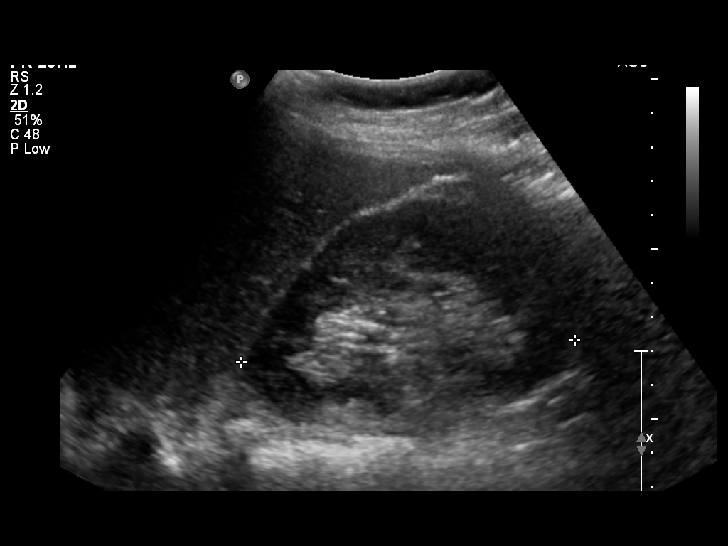

[14 of 25 positions shown; findings below may reference images not displayed]

FINDINGS: Gallbladder

No gallstones or wall thickening visualized. No sonographic Murphy
sign noted.

Common bile duct

Diameter: Measures 2.8 mm

Liver

No focal lesion identified. Within normal limits in parenchymal
echogenicity.

IVC

No abnormality visualized.

Pancreas

Visualized portion unremarkable.

Spleen

Size and appearance within normal limits.

Right Kidney

Length: Measures 9.8 cm. Echogenicity within normal limits. No mass
or hydronephrosis visualized.

Left Kidney

Length: Measures 10.7 cm. Echogenicity within normal limits. No mass
or hydronephrosis visualized.

Abdominal aorta

No aneurysm visualized.
IMPRESSION: Grossly unremarkable abdominal ultrasound.

No cholelithiasis or sonographic evidence for acute cholecystitis.

## 2015-11-10 ENCOUNTER — Ambulatory Visit (INDEPENDENT_AMBULATORY_CARE_PROVIDER_SITE_OTHER): Payer: No Typology Code available for payment source | Admitting: Family Practice

## 2015-11-10 ENCOUNTER — Encounter (INDEPENDENT_AMBULATORY_CARE_PROVIDER_SITE_OTHER): Payer: Self-pay | Admitting: Family Practice

## 2015-11-10 VITALS — BP 120/80 | HR 80 | Temp 98.7°F | Resp 16 | Ht 61.0 in | Wt 113.5 lb

## 2015-11-10 DIAGNOSIS — Z8632 Personal history of gestational diabetes: Secondary | ICD-10-CM | POA: Insufficient documentation

## 2015-11-10 DIAGNOSIS — K5900 Constipation, unspecified: Secondary | ICD-10-CM

## 2015-11-10 DIAGNOSIS — Z0001 Encounter for general adult medical examination with abnormal findings: Secondary | ICD-10-CM

## 2015-11-10 DIAGNOSIS — Z85850 Personal history of malignant neoplasm of thyroid: Secondary | ICD-10-CM

## 2015-11-10 DIAGNOSIS — Z1239 Encounter for other screening for malignant neoplasm of breast: Secondary | ICD-10-CM

## 2015-11-10 DIAGNOSIS — Z114 Encounter for screening for human immunodeficiency virus [HIV]: Secondary | ICD-10-CM

## 2015-11-10 DIAGNOSIS — B009 Herpesviral infection, unspecified: Secondary | ICD-10-CM

## 2015-11-10 LAB — COMPREHENSIVE METABOLIC PANEL
ALT (GPT): 14 U/L (ref 7–33)
AST (GOT): 15 U/L (ref 9–38)
Albumin: 4.7 g/dL (ref 3.5–5.2)
Alkaline Phosphatase (Total): 77 U/L (ref 25–112)
Anion Gap: 9 (ref 4–12)
Bilirubin (Total): 0.4 mg/dL (ref 0.2–1.3)
Calcium: 9.4 mg/dL (ref 8.9–10.2)
Carbon Dioxide, Total: 31 meq/L (ref 22–32)
Chloride: 100 meq/L (ref 98–108)
Creatinine: 0.66 mg/dL (ref 0.38–1.02)
GFR, Calc, African American: >60 mL/min/{1.73_m2}
GFR, Calc, European American: >60 mL/min/{1.73_m2}
Glucose: 185 mg/dL — ABNORMAL HIGH (ref 62–125)
Potassium: 3.8 meq/L (ref 3.6–5.2)
Protein (Total): 7.1 g/dL (ref 6.0–8.2)
Sodium: 140 meq/L (ref 135–145)
Urea Nitrogen: 14 mg/dL (ref 8–21)

## 2015-11-10 LAB — CBC, DIFF
% Basophils: 1 %
% Eosinophils: 2 %
% Immature Granulocytes: 1 %
% Lymphocytes: 20 %
% Monocytes: 4 %
% Neutrophils: 72 %
% Nucleated RBC: 0 %
Absolute Eosinophil Count: 0.18 10*3/uL (ref 0.00–0.50)
Absolute Lymphocyte Count: 1.81 10*3/uL (ref 1.00–4.80)
Basophils: 0.1 10*3/uL (ref 0.00–0.20)
Hematocrit: 40 % (ref 36–45)
Hemoglobin: 13.7 g/dL (ref 11.5–15.5)
Immature Granulocytes: 0.06 10*3/uL — ABNORMAL HIGH (ref 0.00–0.05)
MCH: 30.6 pg (ref 27.3–33.6)
MCHC: 34.4 g/dL (ref 32.2–36.5)
MCV: 89 fL (ref 81–98)
Monocytes: 0.35 10*3/uL (ref 0.00–0.80)
Neutrophils: 6.68 10*3/uL (ref 1.80–7.00)
Nucleated RBC: 0 10*3/uL
Platelet Count: 294 10*3/uL (ref 150–400)
RBC: 4.47 10*6/uL (ref 3.80–5.00)
RDW-CV: 12.2 % (ref 11.6–14.4)
WBC: 9.18 10*3/uL (ref 4.3–10.0)

## 2015-11-10 LAB — LIPID PANEL
Cholesterol (LDL): 79 mg/dL (ref <130)
Cholesterol/HDL Ratio: 3.6
HDL Cholesterol: 51 mg/dL (ref >39)
Non-HDL Cholesterol: 135 mg/dL (ref 0–159)
Total Cholesterol: 186 mg/dL (ref <200)
Triglyceride: 281 mg/dL — ABNORMAL HIGH (ref <150)

## 2015-11-10 LAB — THYROID STIMULATING HORMONE: Thyroid Stimulating Hormone: 0.783 u[IU]/mL (ref 0.400–5.000)

## 2015-11-10 LAB — PR A1C RAPID, ONSITE: Hemoglobin A1C: 4.8 % (ref 4.0–6.0)

## 2015-11-10 MED ORDER — LEVOTHYROXINE SODIUM 137 MCG OR TABS
137.0000 ug | ORAL_TABLET | Freq: Every day | ORAL | Status: DC
Start: 2015-11-10 — End: 2016-02-01

## 2015-11-10 MED ORDER — VALACYCLOVIR HCL 1 G OR TABS
2.0000 g | ORAL_TABLET | Freq: Two times a day (BID) | ORAL | Status: AC
Start: 2015-11-10 — End: 2015-11-11

## 2015-11-10 NOTE — Progress Notes (Signed)
1:45 PM    Preventative Health Care Updates   Since your last visit, please tell us if you have had any of the below services outside of [facility]:     Cervical screening/PAP: YES  If yes, location/date.About 1.5 year ago. ROI was given to obtain medical records.    Mammo:  NO  If yes, location/date.    Colon Screen: NO  If yes, location/ date.    Specialty Care Updates  Have seen a specialist since your last visit: NO   If yes, Name, location and date.    Any forms to complete today? NO       HM Due:   Influenza injection (seasonal) and TdaP- ROI was given to obtain medical records.       Health Maintenance   Topic Date Due   . HIV Screen  03/29/1973   . Tetanus Vaccine  11/04/1984   . Pap Smear  11/04/1993   . Influenza Vaccine (1) 04/14/2015           Future Appointments  Date Time Provider [place]   12/12/2015 3:00 PM Panighetti, Sheffield Slider, MD [facility]       1:54 PM

## 2015-11-10 NOTE — Patient Instructions (Signed)
Thank you for visiting [facility] for your medical care.  You saw Dr. Ashok Norris today.     Please call or send an eCare message if you have any further questions or concerns.  Our clinic phone number is [phone].      You may receive a survey in the mail asking how your experience has been at [facility]. It is important to me that you are always satisfied with the care your receive. I appreciate your input.     It was a pleasure to see you today.        Leading a Healthy Life  Six tips to help improve your health and wellness     This explains how these 6 basic guidelines may improve your health and wellness:   . Eat well to give your body the energy it needs.   . Stay or get active.   . A healthy mind is part of a healthy body.   . Practice safe living habits.   Marland Kitchen Keep your mind and body free of harmful drugs and alcohol.   . Get regular health care.     Tip #1: Eat well to give your body the energy it needs.   Your body needs nutritious foods to stay strong and healthy.   Here are some general eating guidelines:   . Have 2 servings of fish or other seafood 2 times a week (1 serving = 4 ounces).   . If you eat dairy products, choose low-fat (1%) or nonfat ones.   . If you eat meat, cut down on the amount. Replace it with plant-based foods such as beans, whole grains, fruits and vegetables, and nuts and seeds.   . Have less than 1,500 mg of sodium (salt) a day.   . Cut down on "junk food" like alcohol, fatty foods, chips, candy, and other sweets.     Tip #2: Stay or get active.   Exercise for at least 30 minutes at a time, 3 times a week. Regular physical activity can help you:   . Live longer and feel better   . Be stronger and more flexible   . Build strong bones   . Prevent depression   . Strengthen your immune system   . Maintain a healthy body weight     Tip #3: Remember: A healthy mind is part of a healthy body.   A good state of mind can help you make healthy choices.  Here are a few tips for keeping your mind healthy:   . Reduce stress in your life.   . Make some time every day for things that are fun.   . Get enough sleep. Lack of sleep reduces how well you can concentrate, increases mood swings, and raises your risk of having a car accident.   . Ask your health care provider for help if you feel depressed or anxious for more than several days in a row.     Tip #4: Practice safe living habits.   Accidents and Injuries      . Accidents and injuries are the 5th leading cause of death in the [place]  . Accidents in the home cause thousands of permanent injuries every year.     The most common accidents are fires, falls, and drowning. To help yourself and your family stay safe:   Marland Kitchen Install smoke detectors on each floor of your home.   . Make sure everyone in your  family knows how to swim  . Stay safe on the road:  o Wear a seatbelt.   o Do not ride with someone who has been drinking or taking drugs.   o Do not speak on a cell phone or send, read, or write text messages while you are driving.   o Wear a helmet when you ride a bicycle or motorcycle.   o Get enough sleep at night, and do not drive when you are tired.     Hand Hygiene   Protect yourself from germs by washing your hands often. Always wash your hands:   . After you change a diaper or use the toilet   . Before you start and after you finish preparing food     Tip #5: Keep your mind and body free of harmful drugs and alcohol.   Tobacco causes more health problems than any other substance. These problems include lung disease, heart disease, and many types of cancer. The nicotine in tobacco is the most addictive and widely used drug.   Too much alcohol can cause damage to your liver, heart, brain, bones, and other body tissues. Being under the influence of alcohol also increases your chance of being injured in an accident.    Alcohol can cause fetal alcohol syndrome in your children if you drink regularly when you are pregnant.    Street drugs, like marijuana, cocaine, methamphetamine, heroin, or pain pills not prescribed by your doctor can harm your health. They may be mixed with harmful substances, and using them can cause people to put themselves in dangerous situations.     Tip #6: Get regular health care.   Many people think they need to see the doctor only when they are sick. But, health care providers can also help you stay healthy.   . Find a health care provider who works with you to manage your health.   . Ask your health care provider what diseases you are at risk for. Learn what you can do to prevent or control them.   . Get yourself and your family immunized against life-threatening diseases.   Marland Kitchen   Marland Kitchen

## 2015-11-10 NOTE — Progress Notes (Signed)
1:58 PM    Rita Simpson is a 45 year old year old female who is here today for a preventive health visit.     Other problems or concerns today: 1. reports history of thyroid cancer, status-post thyroidectomy 2011. Is now on synthroid.     Moved to [place] a year ago. Not yet established with endocrine.     Patient asks for synthroid prescription. Also due for blood work.      2. Patient asks for a prescription for valacyclovir. 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     GYN HISTORY    Obstetric History    G3   P2   T0   P0   A1   TAB0   SAB1   E0   M0   L0      Term vaginal delivery 2010. Daughter born.   Term vaginal delivery 2015. Son born.  Had gestational diabetes during this pregnany.     MENSTRUAL HISTORY:  Does not menstruate with IUD. Mirena IUD was placed in April 2015.      CERVICAL CANCER SCREENING HISTORY:  Last Pap: 1 1/2 years ago.   Pap history: no history of abnormal paps      SEXUAL HISTORY  Sexual activity: yes, single partner, contraception - IUD  History of STD: none known  Declines testing for sexually transmitted infection. Agrees to HIV test.   History of sexual or physical abuse: No  Have you been hit, kicked, punched, or otherwise hurt by someone within the past year:No    CANCER SCREENING  Family history of colon cancer: NO  Family history of uterine or ovarian cancer: NO  Family history of breast cancer: NO  Denies breast mass or nipple discharge.   Reports last mammogram was last year.     LIFESTYLE  Current dietary habits: healthy diet in general  Calcium: dietary sources only  Current exercise habits: patient is active. 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   Exposure to hazardous materials: No  Guns in the house: No        PMH:    Past Medical History   Diagnosis Date   . Thyroid cancer ([place])    . Gestational diabetes        SH: Reviewed and up-dated.       FH: Reviewed and up-dated.     Medications:  reviewed and up-dated.       Review Of Systems  Weight: stable  Constitutional: denies fatigue, fever, chills, sweats  Regular dental exams: yes  GI: Denies nausea, vomiting, diarrhea. Reports constipation that is a chronic issue. Patient uses fiber, probiotic and miralax.   GU:  Denies dysuria, hematuria, urinary frequency, urinary hesitancy. Denies vaginal discharge, pelvic pain or pressure.    Endocrine: as above.   Skin: denies rash or other active skin concerns  Psych: Depression symptoms: patient feels any sadness or anxiety is due to stress of being a working mother. Declines therapy.  Other: as above.     OBJECTIVE:    BP 120/80 mmHg  Pulse 80  Temp(Src) 98.7 F (37.1 C) (Temporal)  Resp 16  Ht 5\' 1"  (1.549 m)  Wt 113 lb 8.6 oz (51.5 kg)  BMI 21.46 kg/m2  SpO2 99%    PHYSICAL EXAM:  General: healthy, alert, no distress  HEENT:  Eyes: normal.  R TM - obscured by cerumen, L  TM - normal.    Lips, mucosa, and tongue normal. Teeth and gums normal., posterior pharynx without erythema or drainage  Neck: supple and without palpable adenopathy.  Lungs: clear to auscultation and no rales, rhonchi or wheezing.   Heart: normal rate, regular rhythm and no murmurs, clicks, or gallops  Abd: soft, non-tender. BS normal. No masses or organomegaly  Extremities: no clubbing or edema noted bilaterally.  Musculoskeletal: No acute synovitis noted. Motor intact grossly.       ASSESSMENT AND PLAN:    Diagnoses and all orders for this visit:    Encounter for general adult medical examination with abnormal findings  -     MAM MAMMOGRAPHY SCREENING; Future  -     LIPID PANEL  -     COMPREHENSIVE METABOLIC PANEL  -     CBC, DIFF  -     THYROID STIMULATING HORMONE  -     A1C RAPID, ONSITE    Screening for breast cancer  -     MAM MAMMOGRAPHY SCREENING; Future    History of gestational diabetes  -     A1C RAPID, ONSITE    History of thyroid cancer  -     Levothyroxine Sodium (SYNTHROID) 137 MCG Oral Tab; Take 1 tablet (137 mcg)  by mouth daily on an empty stomach.  -     COMPREHENSIVE METABOLIC PANEL  -     CBC, DIFF  -     THYROID STIMULATING HORMONE  -     REFERRAL TO ENDOCRINOLOGY    Constipation, unspecified constipation type  -     COMPREHENSIVE METABOLIC PANEL  -     CBC, DIFF  -     THYROID STIMULATING HORMONE    HSV-1 (herpes simplex virus 1) infection  -     ValACYclovir HCl 1 g Oral Tab; Take 2 tablets (2 g) by mouth every 12 hours for 1 day. At onset of cold sore.    Screening for HIV (human immunodeficiency virus)  -     HIV ANTIGEN AND ANTIBODY SCRN    Other orders  -     Discontinue: Levothyroxine Sodium (SYNTHROID) 137 MCG Oral Tab; Take 137 mcg by mouth daily on an empty stomach.  -     ZEBRA LABELS  -     Cancel: Levothyroxine Sodium (SYNTHROID) 137 MCG Oral Tab; Take 1 tablet (137 mcg) by mouth daily on an empty stomach.  -     Levonorgestrel (Mirena) 52 mg Intrauterine Device; Insert 1 Intra Uterine Device into the uterus One time.

## 2015-11-11 ENCOUNTER — Ambulatory Visit (HOSPITAL_BASED_OUTPATIENT_CLINIC_OR_DEPARTMENT_OTHER): Admit: 2015-11-11 | Discharge: 2015-11-11 | Disposition: A | Discharge status: Home / Self Care | Payer: Self-pay

## 2015-11-11 ENCOUNTER — Encounter (INDEPENDENT_AMBULATORY_CARE_PROVIDER_SITE_OTHER): Payer: Self-pay

## 2015-11-11 LAB — HIV ANTIGEN AND ANTIBODY SCRN
HIV Antigen and Antibody Interpretation: NONREACTIVE
HIV Antigen and Antibody Result: NONREACTIVE

## 2015-11-11 NOTE — Telephone Encounter (Signed)
ROI Received on: 11/11/2015   Requesting Provider name: Cheema  Request Sent to: [facility]  Records needed: Pap, Colonoscopy, Mammo, Vaccines  Request was sent via: Fax; faxed to [phone]   Contact number for follow up: [phone]

## 2015-11-14 ENCOUNTER — Telehealth (INDEPENDENT_AMBULATORY_CARE_PROVIDER_SITE_OTHER): Payer: Self-pay | Admitting: Family Practice

## 2015-11-14 NOTE — Telephone Encounter (Signed)
Provider message:    Please let patient know:   Your hemoglobin A1C is normal. This test is a reflection of your sugars over the last 3 months.    Your TSH or thyroid lab is normal.   HIV is negative or normal.   Your complete blood cell counts or CBC is normal.   Cholesterol is high except triglycerides are high. This can be because labs were done not fasting.   Your complete metabolic panel is essentially normal. This panel looks at your sugar, renal function, electrolytes and liver labs. All are fine except sugar is high because labs were done not fasting.     Please let me know if patient has any questions or concerns.     Thank you.     Ashok Norris, M.D

## 2015-11-14 NOTE — Telephone Encounter (Signed)
Spoke with patient and relayed provider message. Patient responded with understanding. Nothing further needed.  Closing encounter.

## 2015-11-22 ENCOUNTER — Other Ambulatory Visit
Admit: 2015-11-22 | Discharge: 2015-11-22 | Disposition: A | Discharge status: Home / Self Care | Payer: No Typology Code available for payment source

## 2015-11-22 DIAGNOSIS — C73 Malignant neoplasm of thyroid gland: Secondary | ICD-10-CM

## 2015-11-25 ENCOUNTER — Ambulatory Visit: Payer: No Typology Code available for payment source | Attending: Endocrinology | Admitting: Endocrinology

## 2015-11-25 ENCOUNTER — Ambulatory Visit (HOSPITAL_BASED_OUTPATIENT_CLINIC_OR_DEPARTMENT_OTHER): Payer: No Typology Code available for payment source

## 2015-11-25 ENCOUNTER — Other Ambulatory Visit (HOSPITAL_BASED_OUTPATIENT_CLINIC_OR_DEPARTMENT_OTHER): Payer: No Typology Code available for payment source

## 2015-11-25 DIAGNOSIS — C73 Malignant neoplasm of thyroid gland: Secondary | ICD-10-CM | POA: Insufficient documentation

## 2015-11-25 LAB — PATHOLOGY, SURGICAL

## 2015-11-25 LAB — BASIC METABOLIC PANEL
Anion Gap: 6 (ref 4–12)
Calcium: 8.9 mg/dL (ref 8.9–10.2)
Carbon Dioxide, Total: 32 meq/L (ref 22–32)
Chloride: 101 meq/L (ref 98–108)
Creatinine: 0.61 mg/dL (ref 0.38–1.02)
GFR, Calc, African American: >60 mL/min/{1.73_m2}
GFR, Calc, European American: >60 mL/min/{1.73_m2}
Glucose: 89 mg/dL (ref 62–125)
Potassium: 3.4 meq/L — ABNORMAL LOW (ref 3.6–5.2)
Sodium: 139 meq/L (ref 135–145)
Urea Nitrogen: 13 mg/dL (ref 8–21)

## 2015-11-25 LAB — THYROXINE (T4): Thyroxine (T4): 5.5 ug/dL (ref 4.8–10.8)

## 2015-11-25 LAB — THYROID STIMULATING HORMONE: Thyroid Stimulating Hormone: 0.33 u[IU]/mL — ABNORMAL LOW (ref 0.400–5.000)

## 2015-11-29 LAB — THYROGLOBULIN AND ANTIBODY PANEL (THYROID CANCER RECURRENCE)
Thyroglobulin Antibody: <1 [IU]/mL (ref 0.0–1.9)
Thyroglobulin Comment: NEGATIVE
Thyroglobulin: <0.1 ng/mL (ref 0.0–35.0)

## 2015-11-29 NOTE — Telephone Encounter (Signed)
No records received. Called to check on the status of the medical records fax request. Office was closed and I could not leave a voicemail.  Postponing to try again tomorrow.

## 2015-11-30 NOTE — Telephone Encounter (Signed)
Called again today, however the office was closed when I called at 10am as well as at 2pm. Postponing to try again tomorrow.

## 2015-12-01 NOTE — Telephone Encounter (Signed)
No records received. Called to check on the status of the medical records fax request. Could not get a hold of anyone in the medical records department, however the automated message provided their fax number. Resending the fax request on 12/01/2015.    Fax number: [phone]    Contact number for follow-up: [phone]

## 2015-12-07 ENCOUNTER — Encounter (INDEPENDENT_AMBULATORY_CARE_PROVIDER_SITE_OTHER): Payer: Self-pay

## 2015-12-07 NOTE — Telephone Encounter (Signed)
Records received on 12/07/2015 from [facility]. Records are being held in the medical records drawer.    Dr Ena Dawley - Please let PSR know, via addendum to this encounter under the contact log tab, if you would like to review these records.    Patient has an appointment on: 02/08/2016    Data entered/Scanned records:  SCANNED: Pap: YES           Mammo: NO                      Colonoscopy: NO           Diabetic Eye Exam: NO  DATA ENTERED (by lab):            INR: NO                      LDL: NO                      A1C: NO  IMMUNIZATIONS: Pulled and placed in MA inbox for data entry    Records to be shredded in: October 2017

## 2015-12-12 ENCOUNTER — Ambulatory Visit (INDEPENDENT_AMBULATORY_CARE_PROVIDER_SITE_OTHER): Payer: No Typology Code available for payment source | Admitting: Obstetrics & Gynecology

## 2015-12-14 ENCOUNTER — Ambulatory Visit (INDEPENDENT_AMBULATORY_CARE_PROVIDER_SITE_OTHER): Payer: No Typology Code available for payment source | Admitting: Obstetrics & Gynecology

## 2015-12-14 ENCOUNTER — Encounter (INDEPENDENT_AMBULATORY_CARE_PROVIDER_SITE_OTHER): Payer: Self-pay | Admitting: Obstetrics & Gynecology

## 2015-12-14 VITALS — BP 118/74 | HR 86 | Temp 98.1°F | Ht 61.0 in | Wt 116.6 lb

## 2015-12-14 DIAGNOSIS — Z01419 Encounter for gynecological examination (general) (routine) without abnormal findings: Secondary | ICD-10-CM | POA: Insufficient documentation

## 2015-12-14 NOTE — Progress Notes (Signed)
CC:    Chief Complaint   Patient presents with   . New Patient   . Wellness       HPI:   Rita Simpson is a 45 year old G18P2012 female who presents today to establish GYN care.  She has no complaints.  Mammogram has already been ordered by her PCP, but hasn't been completed yet.      POB/GYNHx: Last PAP was 2015 and was ASCUS HPV negative.  SVDx2, SABx1.  Mirena IUD in place for contraception x32yrs, is amenorrheic.    Problem List:   Patient Active Problem List    Diagnosis Date Noted   . History of thyroid cancer [Z85.850] 11/10/2015   . History of gestational diabetes [Z86.32] 11/10/2015       Past Medical History:    Past Medical History   Diagnosis Date   . Thyroid cancer ([place])    . Gestational diabetes        Past Surgical History:    Past Surgical History   Procedure Laterality Date   . Anes; thyroidectomy  2011   . Induced abortion dilation and curettage       miscarriage       Family History:   Family History   Problem Relation Age of Onset   . Cancer No Hx Of    . Diabetes No Hx Of    . Heart Disease No Hx Of    . Stroke No Hx Of    . Substance Abuse No Hx Of    . Mental Illness No Hx Of    . Thyroid Disease No Hx Of    . Hypertension Brother        Social History:   Social History   Substance Use Topics   . Smoking status: Never Smoker    . Smokeless tobacco: Not on file   . Alcohol Use: 0.0 oz/week     0 Standard drinks or equivalent per week      Comment: 1 drink once or twice a week.        Allergies: Review of patient's allergies indicates no known allergies.    Current Medications:   Current Outpatient Prescriptions   Medication Sig Dispense Refill   . Levonorgestrel (Mirena) 52 mg Intrauterine Device Insert 1 Intra Uterine Device into the uterus One time.     . Levothyroxine Sodium (SYNTHROID) 137 MCG Oral Tab Take 1 tablet (137 mcg) by mouth daily on an empty stomach. 90 tablet 1     No current facility-administered medications for this visit.       ROS:   All systems were reviewed and were  negative.    PHYSICAL EXAM:   BP 118/74 mmHg  Pulse 86  Temp(Src) 98.1 F (36.7 C) (Temporal)  Ht 5\' 1"  (1.549 m)  Wt 116 lb 9.6 oz (52.889 kg)  BMI 22.04 kg/m2  SpO2 98%  Gen: well appearing, nad, ambulates without assistance   Ears, Nose, Mouth, Throat: oropharynx clear, no thyromegaly or nodules, no LAD   Breasts: symmetric, no masses, nipple discharge, or lymphadenopathy  Cardiovascular: regular rate and rhythm, 2+ peripheral pulses  Respiratory:  Good respiratory effort, lungs clear to auscultation  Gastrointestinal: abdomen soft, nontender, nondistended.  No masses, hernias, or hepatosplenomegaly  Genitourinary: no groin lymphadenopathy, normal external female genitalia, normal bartholins, skenes, urethral meatus, anus.  No hemorrhoids. Vaginal mucosa pink, well rugated, no vaginal wall lesions. Cervix  without lesions.  No IUD strings visible.  Skin: no lesions or rashes  Neurological: alert and oriented  Lymphatic: no cervical or inguinal lymphadenopathy      ASSESSMENT AND PLAN:     1) Screening: Mammogram already ordered.  Due for PAP and HPV test in April of 2018, which I discussed with the patient.  2) No IUD strings visible: Pelvic ultrasound ordered.    GYN care established.  RTC in April 2018 for a PAP or sooner PRN or depending on U/S results.Marland Kitchen

## 2015-12-15 ENCOUNTER — Ambulatory Visit: Payer: No Typology Code available for payment source | Attending: Endocrinology

## 2015-12-15 ENCOUNTER — Other Ambulatory Visit: Payer: Self-pay | Admitting: Endocrinology

## 2015-12-15 DIAGNOSIS — Z85850 Personal history of malignant neoplasm of thyroid: Secondary | ICD-10-CM

## 2015-12-20 ENCOUNTER — Other Ambulatory Visit (INDEPENDENT_AMBULATORY_CARE_PROVIDER_SITE_OTHER): Payer: Self-pay | Admitting: Obstetrics & Gynecology

## 2015-12-20 DIAGNOSIS — Z01419 Encounter for gynecological examination (general) (routine) without abnormal findings: Secondary | ICD-10-CM

## 2015-12-23 ENCOUNTER — Ambulatory Visit: Payer: No Typology Code available for payment source | Attending: Obstetrics & Gynecology

## 2015-12-23 ENCOUNTER — Encounter (INDEPENDENT_AMBULATORY_CARE_PROVIDER_SITE_OTHER): Payer: Self-pay | Admitting: Obstetrics & Gynecology

## 2015-12-23 DIAGNOSIS — Z01419 Encounter for gynecological examination (general) (routine) without abnormal findings: Secondary | ICD-10-CM | POA: Insufficient documentation

## 2016-02-01 ENCOUNTER — Other Ambulatory Visit: Payer: Self-pay

## 2016-02-01 DIAGNOSIS — Z85850 Personal history of malignant neoplasm of thyroid: Secondary | ICD-10-CM

## 2016-02-01 NOTE — Telephone Encounter (Signed)
[facility] is requesting for  L-thyroxine 147mcg  Last fill date not noted

## 2016-02-02 NOTE — Telephone Encounter (Signed)
Will defer refill auth decision to provider since last labs were abnormal.  Ok to continue as requested.  Thank you.    11/25/2015 6:26 PM - [facility] Results Interface, User     Component Results     Component Value Ref Range & Units Status Lab   Thyroid Stimulating Hormone 0.330  0.400 - 5.000 u[IU]/mL Final [facility]

## 2016-02-06 MED ORDER — LEVOTHYROXINE SODIUM 137 MCG OR TABS
137.0000 ug | ORAL_TABLET | Freq: Every day | ORAL | 1 refills | Status: DC
Start: 2016-02-06 — End: 2016-02-15

## 2016-02-06 NOTE — Telephone Encounter (Signed)
Dear provider     - we are re-routing this refill request to you since it is outstanding on our end (please refer to the original routing request and/or documentation notes).     Thanks,   [facility]

## 2016-02-06 NOTE — Telephone Encounter (Signed)
Refill approved. Nothing further needed.  Closing encounter.

## 2016-02-08 ENCOUNTER — Encounter (INDEPENDENT_AMBULATORY_CARE_PROVIDER_SITE_OTHER): Payer: No Typology Code available for payment source | Admitting: Family Practice

## 2016-02-15 ENCOUNTER — Encounter (INDEPENDENT_AMBULATORY_CARE_PROVIDER_SITE_OTHER): Payer: Self-pay | Admitting: Family Practice

## 2016-02-15 ENCOUNTER — Ambulatory Visit (INDEPENDENT_AMBULATORY_CARE_PROVIDER_SITE_OTHER): Payer: No Typology Code available for payment source | Admitting: Family Practice

## 2016-02-15 VITALS — BP 125/84 | HR 70 | Temp 98.0°F | Resp 10 | Ht 61.0 in | Wt 115.0 lb

## 2016-02-15 DIAGNOSIS — M25511 Pain in right shoulder: Secondary | ICD-10-CM

## 2016-02-15 DIAGNOSIS — Z6821 Body mass index (BMI) 21.0-21.9, adult: Secondary | ICD-10-CM

## 2016-02-15 DIAGNOSIS — Z85850 Personal history of malignant neoplasm of thyroid: Secondary | ICD-10-CM

## 2016-02-15 DIAGNOSIS — M62838 Other muscle spasm: Secondary | ICD-10-CM

## 2016-02-15 DIAGNOSIS — K5909 Other constipation: Secondary | ICD-10-CM

## 2016-02-15 MED ORDER — LEVOTHYROXINE SODIUM 137 MCG OR TABS
137.0000 ug | ORAL_TABLET | Freq: Every day | ORAL | 1 refills | Status: DC
Start: 2016-02-15 — End: 2016-05-17

## 2016-02-15 NOTE — Progress Notes (Signed)
Preventative Health Care Updates   Since your last visit, please tell us if you have had any of the below services outside of [facility]:                                                                            Cervical screening/PAP:N/A      Mammo: N/A      Colon Screen: NO   If yes, location/ date.    Specialty Care Updates  Have seen a specialist since your last visit: NO   If yes, Name, location and date.    Any forms to complete today? NO     HM Due:     UTD     Health Maintenance   Topic Date Due   . Influenza Vaccine (1) 04/13/2016   . Pap Smear  11/24/2016   . Tetanus Vaccine  08/27/2022   . HIV Screen  Completed           No future appointments.

## 2016-02-15 NOTE — Progress Notes (Signed)
1:18 PM  Rita Simpson is a pleasant 45 year old female here with the following concerns: follow-up thyroid.       HPI:     Patient reports right worse left shoulder pain. Reports history of muscle tension, worse since holding young child.  Also has a history of right carpal tunnel syndrome. Has seen occupation therapy and has splints but symptoms persist.     Patient has seen endocrine for history of thyroid cancer. No change in synthroid dosage. Notes reviewed.       ROS:  As above. Denies shortness of breath, cough. Denies nausea, vomiting, diarrhea. Patient reports continued constipation.        PMH:    Patient Active Problem List   Diagnosis   . History of thyroid cancer   . History of gestational diabetes   . Routine gynecological examination       Reviewed and up-dated as needed.      SH:  Reviewed and up-dated as needed.    Social History   Substance Use Topics   . Smoking status: Never Smoker   . Smokeless tobacco: Not on file   . Alcohol use 0.0 oz/week      Comment: 1 drink once or twice a week.        Medications:  Reviewed and up-dated as needed.      Current Outpatient Prescriptions   Medication Sig Dispense Refill   . Levonorgestrel (Mirena) 52 mg Intrauterine Device Insert 1 Intra Uterine Device into the uterus One time.     . Levothyroxine Sodium (SYNTHROID) 137 MCG Oral Tab Take 1 tablet (137 mcg) by mouth daily on an empty stomach. 90 tablet 1     No current facility-administered medications for this visit.          OBJECTIVE:    BP 125/84   Pulse 70   Temp 98 F (36.7 C) (Temporal)   Resp 10   Ht 5\' 1"  (1.549 m)   Wt 115 lb (52.2 kg)   LMP  (LMP Unknown) Comment: IUD   SpO2 100%   BMI 21.73 kg/m     Physical exam:  General: healthy, alert, no distress  Lungs: clear to auscultation  Heart: normal rate, regular rhythm and no murmurs, clicks, or gallops  MS:  Spine non tender. Bilateral trapezius tension. 2 + radial.      ASSESSMENT/PLAN:    Diagnoses and all orders for this  visit:    Right shoulder pain, unspecified chronicity  -     REFERRAL TO MASSAGE THERAPY    History of thyroid cancer  -     Levothyroxine Sodium (SYNTHROID) 137 MCG Oral Tab; Take 1 tablet (137 mcg) by mouth daily on an empty stomach.    Muscle spasm  -     REFERRAL TO MASSAGE THERAPY    Chronic constipation: recommend plenty of water and over-the-counter metamucil. May also use over-the-counter Miralax.    See also patient's after visit summary for further instructions and advice given.

## 2016-02-15 NOTE — Patient Instructions (Addendum)
Over-the-counter metamucil or psyllium husk one spoon daily with 8 ounces of liquid.    Very important to stay hydrated and drink plenty of water throughout the day.    You may also try over-the-counter Miralax if needed.       Patient Education     Constipation (Adult)  Constipation means that you have bowel movements that are less frequent than usual. Stools often become very hard and difficult to pass.  Constipation is very common. At some point in life it affects almost everyone. Since everyone's bowel habits are different, what is constipation to one person may not be to another. Your healthcare provider may do tests to diagnose constipation. It depends on whathe or shefinds when evaluating you.    Symptoms of constipation include:   Abdominal pain   Bloating   Vomiting   Painful bowel movements   Itching, swelling, bleeding, or pain around the anus  Causes  Constipation can have many causes. These include:   Diet low in fiber   Too much dairy   Not drinking enough liquids   Lack of exercise or physical activity. This is especially true for older adults.   Changes in lifestyle or daily routine, including pregnancy, aging, work, and travel   Frequent use or misuse of laxatives   Ignoring the urge to have a bowel movement or delaying it until later   Medicines, such as certain prescription pain medicines, iron supplements, antacids, certain antidepressants, and calcium supplements   Diseases like irritable bowel syndrome, bowel obstructions, stroke, diabetes, thyroid disease, Parkinson disease, hemorrhoids, and colon cancer  Complications  Potential complications of constipation can include:   Hemorrhoids   Rectal bleeding from hemorrhoids or anal fissures(skin tears)   Hernias   Dependency on laxatives   Chronic constipation   Fecal impaction   Bowel obstruction or perforation  Home care  All treatment should be done after talking with your healthcare provider. This is especially true if  you have another medical problems, are taking prescription medicines, or are an older adult. Treatment most often involves lifestyle changes. You may also need medicines. Your healthcare provider will tell you which will work best for you. Follow the advice below to help avoid this problem in the future.  Lifestyle changes  These lifestyle changes can help prevent constipation:   Diet. Eat a high-fiber diet, with fresh fruit and vegetables, and reduce dairy intake, meats, and processed foods   Fluids. It's important to get enough fluids each day. Drink plenty of water when you eat more fiber. If you are on diet that limits the amount of fluid you can have, talk about this with your healthcare provider.   Regular exercise. Check with your healthcare provider first.  Medications  Take any medicines as directed. Some laxatives are safe to use only every now and then. Others can be taken on a regular basis. Talk with your doctor or pharmacist if you have questions.  Prescription pain medicines can cause constipation. If you are taking this kind of medicine, ask your healthcare provider if you should also take a stool softener.  Medicines you may take to treat constipation include:   Fiber supplements   Stool softeners   Laxatives   Enemas   Rectal suppositories  Follow-up care  Follow up with your healthcare provider if symptoms don't get better in the next few days. You may need to have more tests or see a specialist.  Call 911  Call 911 if any of  these occur:   Trouble breathing   Stiff, rigid abdomen that is severely painful to touch   Confusion   Fainting or loss of consciousness   Rapid heart rate   Chest pain  When to seek medical advice  Call your healthcare provider right away if any of these occur:   Fever over 100.3F (38C)   Failure to resume normal bowel movements   Pain in your abdomen or back gets worse   Nausea or vomiting   Swelling in your abdomen   Blood in the stool   Black,  tarry stool   Involuntary weight loss   Weakness  Date Last Reviewed: 08/11/2014   2000-2017 [employer]. [address] [place] [address]. All rights reserved. This information is not intended as a substitute for professional medical care. Always follow your healthcare professional's instructions.

## 2016-04-01 NOTE — Addendum Note (Signed)
Encounter addended by: Corliss Skains on: 04/01/2016 12:14 PM<BR>    Actions taken: Letter status changed

## 2016-05-17 ENCOUNTER — Encounter (INDEPENDENT_AMBULATORY_CARE_PROVIDER_SITE_OTHER): Payer: Self-pay | Admitting: Family Practice

## 2016-05-17 ENCOUNTER — Ambulatory Visit (INDEPENDENT_AMBULATORY_CARE_PROVIDER_SITE_OTHER): Payer: No Typology Code available for payment source | Admitting: Family Practice

## 2016-05-17 VITALS — BP 115/77 | HR 74 | Temp 98.1°F | Resp 10 | Ht 60.98 in | Wt 115.7 lb

## 2016-05-17 DIAGNOSIS — Z23 Encounter for immunization: Secondary | ICD-10-CM

## 2016-05-17 DIAGNOSIS — Z85850 Personal history of malignant neoplasm of thyroid: Secondary | ICD-10-CM

## 2016-05-17 DIAGNOSIS — E781 Pure hyperglyceridemia: Secondary | ICD-10-CM

## 2016-05-17 DIAGNOSIS — L309 Dermatitis, unspecified: Secondary | ICD-10-CM

## 2016-05-17 DIAGNOSIS — Z6821 Body mass index (BMI) 21.0-21.9, adult: Secondary | ICD-10-CM

## 2016-05-17 DIAGNOSIS — E890 Postprocedural hypothyroidism: Secondary | ICD-10-CM

## 2016-05-17 LAB — LIPID PANEL
Cholesterol (LDL): 92 mg/dL (ref <130)
Cholesterol/HDL Ratio: 2.7
HDL Cholesterol: 60 mg/dL (ref >39)
Non-HDL Cholesterol: 102 mg/dL (ref 0–159)
Total Cholesterol: 162 mg/dL (ref <200)
Triglyceride: 48 mg/dL (ref <150)

## 2016-05-17 LAB — THYROID STIMULATING HORMONE: Thyroid Stimulating Hormone: 0.09 u[IU]/mL — ABNORMAL LOW (ref 0.400–5.000)

## 2016-05-17 MED ORDER — TRIAMCINOLONE ACETONIDE 0.1 % EX OINT
TOPICAL_OINTMENT | Freq: Two times a day (BID) | CUTANEOUS | 3 refills | Status: DC
Start: 2016-05-17 — End: 2017-09-05

## 2016-05-17 MED ORDER — LEVOTHYROXINE SODIUM 137 MCG OR TABS
137.0000 ug | ORAL_TABLET | Freq: Every day | ORAL | 1 refills | Status: DC
Start: 2016-05-17 — End: 2016-11-15

## 2016-05-17 NOTE — Progress Notes (Signed)
9:02 AM  9:10 AM          Preventative Health Care Updates   Since your last visit, please tell us if you have had any of the below services outside of [facility]:     Cervical screening/PAP: NO  If yes, location/date.    Mammo:  NO  If yes, location/date.    Colon Screen: NO  If yes, location/ date.    Specialty Care Updates  Have seen a specialist since your last visit: NO   If yes, Name, location and date.    Any forms to complete today? NO       HM Due:     FLU-today      Health Maintenance   Topic Date Due   . Influenza Vaccine (1) 05/13/2016   . Pap Smear  11/24/2016   . Tetanus Vaccine  08/27/2022   . HIV Screen  Completed           No future appointments.    Vaccine Screening Questions    1. Are you allergic to Latex? NO    2.  Have you had a serious reaction or an allergic reaction to a vaccine?  NO    3.  Currently have a moderate or severe illness, including fever? (Don't Ask if vaccine   ordered by provider same day)  NO    4.  Ever had a seizure or a brain or other nervous system problem syndrome associated with a vaccine? (DTaP/TDaP/DTP pertinent) NO    5.  Is patient receiving  any live vaccinations today? (Varicella-Chickenpox, MMR-Measles/Mumps/Rubella, Zoster-Shingles, Flumist, Yellow Fever) NOTE: oral rotavirus is exempt  NO    If YES to any of the questions above - Do NOT give vaccine.  Consult with RN or provider in clinic.  (#4 can be YES if all Live vaccine questions are answered NO)    If NO to all questions above - Patient may receive vaccine.    6.  Do you need to receive the Flu vaccine today? YES - Additional Flu Questions  Flu Vaccine Screening Questions:    Ever had a serious allergic reaction to eggs?  NO    Ever had Guillain-Barre syndrome associated with a vaccine? NO    Less than 6 months old? NO    If YES to any of the Flu questions above - NO Flu Vaccine to be given.  Patient may consult provider as needed.    If NO to all questions above - Patient may receive Flu Shot (IM)    If  between 6 months and 10 years of age, was flu vaccine received last year?  NO  If NO to above question:  . Children who are receiving influenza vaccine for the first time - administer 2 doses of the current influenza vaccine (separated by at least 4 weeks).          Vaccine information sheet(s) discussed, patient/parent/guardian verbalized understanding? YES     VIS given 05/17/2016 by Caroleen Hamman, MA .      Vaccine given today without initial adverse effect. [place] Certified Medical Assistant

## 2016-05-17 NOTE — Progress Notes (Signed)
9:13 AM  Rita Simpson is a pleasant 45 year old female here with the following concerns: follow-up chronic conditions, postoperative hypothyroidism, history of thyroid cancer.        HPI:     Patient reports she is well. Takes her medicine regularly.    Reports history of eczema and asks for tpoical ointment. Worse now due to weather changes.          ROS:  Denies dysphagia, nausea, vomiting, diarrhea. Reports constipation that comes and goes. "It's ok. I sort of like deal with it now."  She uses fiber supplement and Miralax as needed.   Denies fever, sweats, chills, weight changes. Denies shortness of breath, cough.    Denies problems with urination.       PMH: Reviewed and up-dated as needed.      Patient Active Problem List   Diagnosis   . History of thyroid cancer   . History of gestational diabetes   . Routine gynecological examination     Health Maintenance   Topic Date Due   . Influenza Vaccine (1) 05/13/2016   . Pap Smear  11/24/2016   . Tetanus Vaccine  08/27/2022   . HIV Screen  Completed     SH:  Reviewed and up-dated as needed.    Social History   Substance Use Topics   . Smoking status: Never Smoker   . Smokeless tobacco: Never Used   . Alcohol use 0.0 oz/week      Comment: 1 drink once or twice a week.          FH:   Reviewed and up-dated as needed.   Family History   Problem Relation Age of Onset   . Hypertension Brother    . Colon Cancer Maternal Uncle In his 71's.    . Diabetes No Hx Of    . Heart Disease No Hx Of    . Stroke No Hx Of    . Substance Abuse No Hx Of    . Mental Illness No Hx Of    . Thyroid Disease No Hx Of    . Breast Cancer No Hx Of    . Ovarian Cancer No Hx Of            Medications:  Reviewed and up-dated as needed.   Current Outpatient Prescriptions   Medication Sig Dispense Refill   . Levonorgestrel (Mirena) 52 mg Intrauterine Device Insert 1 Intra Uterine Device into the uterus One time.     . Levothyroxine Sodium (SYNTHROID) 137 MCG Oral Tab Take 1 tablet (137 mcg) by  mouth daily on an empty stomach. 90 tablet 1     No current facility-administered medications for this visit.               OBJECTIVE:    BP 115/77   Pulse 74   Temp 98.1 F (36.7 C) (Temporal)   Resp 10   Ht 5' 0.98" (1.549 m)   Wt 115 lb 11.9 oz (52.5 kg)   LMP  (LMP Unknown)   SpO2 99%   BMI 21.88 kg/m     Physical exam:  General: healthy, alert, no distress  Neck: supple. Thyroidectomy scar.   Lungs: clear to auscultation  Heart: normal rate, regular rhythm and no murmurs, clicks, or gallops  Skin: scaly patch lower abdomen.   Mental status exam:  alert and oriented.  Displays no disorder of thought or affect.        ASSESSMENT/PLAN:  Diagnoses and all orders for this visit:    Postoperative hypothyroidism  -     THYROID STIMULATING HORMONE  -     Levothyroxine Sodium (SYNTHROID) 137 MCG Oral Tab; Take 1 tablet (137 mcg) by mouth daily on an empty stomach.    History of thyroid cancer  -     THYROID STIMULATING HORMONE  -     Levothyroxine Sodium (SYNTHROID) 137 MCG Oral Tab; Take 1 tablet (137 mcg) by mouth daily on an empty stomach.    Need for vaccination  -     influenza vaccine quadrivalent PF (adult) 0.5 mL IM - [id number]    Hypertriglyceridaemia  -     LIPID PANEL-- patient fasting today.     Eczema, unspecified type  -     Triamcinolone Acetonide 0.1 % External Ointment; Apply topically 2 times a day. To eczema for 2 weeks.    Other orders  -     Cancel: Levothyroxine Sodium (SYNTHROID) 137 MCG Oral Tab; Take 1 tablet (137 mcg) by mouth daily on an empty stomach.

## 2016-05-18 ENCOUNTER — Other Ambulatory Visit (INDEPENDENT_AMBULATORY_CARE_PROVIDER_SITE_OTHER): Payer: Self-pay | Admitting: Family Practice

## 2016-05-18 DIAGNOSIS — E890 Postprocedural hypothyroidism: Secondary | ICD-10-CM

## 2016-06-15 NOTE — Telephone Encounter (Signed)
Final Review Reminder:    Patient records from [facility] will be shredded the end of the month on 07/12/16.

## 2016-06-18 ENCOUNTER — Ambulatory Visit (INDEPENDENT_AMBULATORY_CARE_PROVIDER_SITE_OTHER): Payer: Self-pay

## 2016-10-18 ENCOUNTER — Encounter (INDEPENDENT_AMBULATORY_CARE_PROVIDER_SITE_OTHER): Payer: No Typology Code available for payment source | Admitting: Family Practice

## 2016-11-15 ENCOUNTER — Encounter (INDEPENDENT_AMBULATORY_CARE_PROVIDER_SITE_OTHER): Payer: Self-pay | Admitting: Physician Assistant

## 2016-11-15 ENCOUNTER — Ambulatory Visit (INDEPENDENT_AMBULATORY_CARE_PROVIDER_SITE_OTHER): Payer: No Typology Code available for payment source | Admitting: Physician Assistant

## 2016-11-15 ENCOUNTER — Encounter (INDEPENDENT_AMBULATORY_CARE_PROVIDER_SITE_OTHER): Payer: No Typology Code available for payment source | Admitting: Family Practice

## 2016-11-15 VITALS — BP 118/70 | HR 79 | Temp 97.0°F | Resp 12 | Wt 119.9 lb

## 2016-11-15 DIAGNOSIS — Z6822 Body mass index (BMI) 22.0-22.9, adult: Secondary | ICD-10-CM

## 2016-11-15 DIAGNOSIS — G8929 Other chronic pain: Secondary | ICD-10-CM

## 2016-11-15 DIAGNOSIS — M546 Pain in thoracic spine: Secondary | ICD-10-CM

## 2016-11-15 DIAGNOSIS — M25511 Pain in right shoulder: Secondary | ICD-10-CM

## 2016-11-15 DIAGNOSIS — Z85850 Personal history of malignant neoplasm of thyroid: Secondary | ICD-10-CM

## 2016-11-15 DIAGNOSIS — E890 Postprocedural hypothyroidism: Secondary | ICD-10-CM

## 2016-11-15 LAB — THYROID STIMULATING HORMONE: Thyroid Stimulating Hormone: 0.038 u[IU]/mL — ABNORMAL LOW (ref 0.400–5.000)

## 2016-11-15 MED ORDER — LEVOTHYROXINE SODIUM 137 MCG OR TABS
137.0000 ug | ORAL_TABLET | Freq: Every day | ORAL | 1 refills | Status: DC
Start: 2016-11-15 — End: 2016-11-19

## 2016-11-15 NOTE — Patient Instructions (Signed)
Please call the number provided to schedule your thyroid ultrasound.  We will also check for thyroglobulin.

## 2016-11-15 NOTE — Progress Notes (Signed)
11:36 AM  11:42 AM      Preventative Health Care Updates   Since your last visit, please tell us if you have had any of the below services outside of [facility]:     Cervical screening/PAP: NO  If yes, location/date.    Mammo:  NO  If yes, location/date.    Colon Screen: NO  If yes, location/ date.    Specialty Care Updates  Have seen a specialist since your last visit: NO   If yes, Name, location and date.    Any forms to complete today? NO       HM Due:     UTD    Health Maintenance   Topic Date Due    Cervical Cancer Screening (Pap Smear)  11/24/2016    Depression Screening (PHQ-2)  12/13/2016    Tetanus Vaccine  08/27/2022    Influenza Vaccine  Completed    HIV Screening  Completed           No future appointments.    Jerline Pain, MA

## 2016-11-15 NOTE — Progress Notes (Signed)
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  Last TSH was low in October 2017.  She was kept at her dose of 136mcg/day because she has previously had large variations in TSH with small adjustments in medication.    Last seen by endocrinology in April 2017. Advised to have annual thyroglobulin and ultrasound for surveillance.    Review of Systems   Constitutional: Negative for appetite change, chills, fatigue, fever and unexpected weight change.   Endocrine: Negative for cold intolerance and heat intolerance.       I personally reviewed and confirmed the ROS, past medical history, past surgical history and social history in the record with the patient today.    Medications were reviewed and updated in the chart today.    Allergies were reviewed and updated in the chart today as below:    Review of patient's allergies indicates:  No Known Allergies    OBJECTIVE:  BP 118/70    Pulse 79    Temp 97 F (36.1 C) (Temporal)    Resp 12    Wt 119 lb 14.9 oz (54.4 kg)    SpO2 100%    BMI 22.67 kg/m     Physical Exam   Constitutional: She is oriented to person, place, and time. She appears well-developed and well-nourished.   HENT:   Head: Normocephalic and atraumatic.   Eyes: Conjunctivae and EOM are normal.   Neck: Normal range of motion. Neck supple. No thyromegaly present.   Cardiovascular: Normal rate, regular rhythm and normal heart sounds.    Pulmonary/Chest: Effort normal and breath sounds normal.   Neurological: She is alert and oriented to person, place, and time.         ASSESSMENT/PLAN:  Postoperative hypothyroidism  History of thyroid cancer  Re-check TSH. Is due for annual thyroglobulin check and ultrasound.   - THYROID STIMULATING HORMONE  - Levothyroxine Sodium (SYNTHROID) 137 MCG Oral Tab;  Take 1 tablet (137 mcg) by mouth daily on an empty stomach.  Dispense: 90 tablet; Refill: 1  - THYROGLOBULIN PANEL  - US THYROID    Chronic right shoulder pain  Thoracic spine pain  Has recurring upper back pain and right shoulder pain, worse after physical activity. She is an avid skier and pain is worse after skiing. Has responded well to massage therapy. Referral placed.   - REFERRAL TO MASSAGE THERAPY

## 2016-11-16 LAB — THYROGLOBULIN AND ANTIBODY PANEL (THYROID CANCER RECURRENCE)
Thyroglobulin Antibody: <1 [IU]/mL (ref 0.0–1.9)
Thyroglobulin: <0.1 ng/mL (ref 0.0–35.0)

## 2016-11-19 ENCOUNTER — Other Ambulatory Visit (INDEPENDENT_AMBULATORY_CARE_PROVIDER_SITE_OTHER): Payer: Self-pay | Admitting: Physician Assistant

## 2016-11-19 ENCOUNTER — Encounter (INDEPENDENT_AMBULATORY_CARE_PROVIDER_SITE_OTHER): Payer: Self-pay | Admitting: Physician Assistant

## 2016-11-19 DIAGNOSIS — E890 Postprocedural hypothyroidism: Secondary | ICD-10-CM

## 2016-11-19 MED ORDER — LEVOTHYROXINE SODIUM 125 MCG OR TABS
125.0000 ug | ORAL_TABLET | Freq: Every day | ORAL | 0 refills | Status: DC
Start: 2016-11-19 — End: 2017-07-11

## 2016-11-19 NOTE — Telephone Encounter (Signed)
Responded to patient.

## 2016-11-19 NOTE — Telephone Encounter (Signed)
Please see [facility]. Patient  Has follow up questions about TSH and weight gain.

## 2017-01-04 ENCOUNTER — Ambulatory Visit: Payer: No Typology Code available for payment source | Attending: Endocrinology | Admitting: Endocrinology

## 2017-01-04 ENCOUNTER — Ambulatory Visit (HOSPITAL_BASED_OUTPATIENT_CLINIC_OR_DEPARTMENT_OTHER): Payer: No Typology Code available for payment source

## 2017-01-04 DIAGNOSIS — C73 Malignant neoplasm of thyroid gland: Secondary | ICD-10-CM

## 2017-01-04 DIAGNOSIS — Z85850 Personal history of malignant neoplasm of thyroid: Secondary | ICD-10-CM | POA: Insufficient documentation

## 2017-01-04 LAB — THYROID STIMULATING HORMONE: Thyroid Stimulating Hormone: 0.03 u[IU]/mL — ABNORMAL LOW (ref 0.400–5.000)

## 2017-01-04 LAB — T4, FREE: Thyroxine (Free): 1.5 ng/dL — ABNORMAL HIGH (ref 0.6–1.2)

## 2017-04-05 ENCOUNTER — Ambulatory Visit: Payer: No Typology Code available for payment source | Attending: Endocrinology

## 2017-04-05 DIAGNOSIS — C73 Malignant neoplasm of thyroid gland: Secondary | ICD-10-CM | POA: Insufficient documentation

## 2017-04-05 LAB — T4, FREE: Thyroxine (Free): 1.6 ng/dL — ABNORMAL HIGH (ref 0.6–1.2)

## 2017-04-05 LAB — THYROID STIMULATING HORMONE: Thyroid Stimulating Hormone: 0.054 u[IU]/mL — ABNORMAL LOW (ref 0.400–5.000)

## 2017-07-11 ENCOUNTER — Other Ambulatory Visit (INDEPENDENT_AMBULATORY_CARE_PROVIDER_SITE_OTHER): Payer: Self-pay | Admitting: Physician Assistant

## 2017-07-11 DIAGNOSIS — E890 Postprocedural hypothyroidism: Secondary | ICD-10-CM

## 2017-07-11 NOTE — Telephone Encounter (Signed)
Will defer refill auth decision to provider since last thyroid labs were abnormal.  Ok to continue as requested?  Thank you.

## 2017-07-12 MED ORDER — LEVOTHYROXINE SODIUM 112 MCG OR TABS
112.0000 ug | ORAL_TABLET | Freq: Every day | ORAL | 1 refills | Status: DC
Start: 2017-07-12 — End: 2017-11-29

## 2017-09-05 ENCOUNTER — Ambulatory Visit (INDEPENDENT_AMBULATORY_CARE_PROVIDER_SITE_OTHER): Payer: No Typology Code available for payment source | Admitting: Physician Assistant

## 2017-09-05 ENCOUNTER — Ambulatory Visit (INDEPENDENT_AMBULATORY_CARE_PROVIDER_SITE_OTHER): Payer: Self-pay

## 2017-09-05 VITALS — BP 103/73 | HR 83 | Temp 98.0°F | Resp 14 | Ht 61.0 in | Wt 116.4 lb

## 2017-09-05 DIAGNOSIS — Z23 Encounter for immunization: Secondary | ICD-10-CM

## 2017-09-05 DIAGNOSIS — S8002XA Contusion of left knee, initial encounter: Secondary | ICD-10-CM

## 2017-09-05 DIAGNOSIS — L309 Dermatitis, unspecified: Secondary | ICD-10-CM

## 2017-09-05 DIAGNOSIS — E890 Postprocedural hypothyroidism: Secondary | ICD-10-CM

## 2017-09-05 LAB — T3: Triiodothyronine (T3): 72 ng/dL — ABNORMAL LOW (ref 73–178)

## 2017-09-05 LAB — THYROID STIMULATING HORMONE: Thyroid Stimulating Hormone: 0.598 u[IU]/mL (ref 0.400–5.000)

## 2017-09-05 LAB — T4, FREE: Thyroxine (Free): 1.4 ng/dL — ABNORMAL HIGH (ref 0.6–1.2)

## 2017-09-05 MED ORDER — TRIAMCINOLONE ACETONIDE 0.1 % EX OINT
TOPICAL_OINTMENT | Freq: Two times a day (BID) | CUTANEOUS | 3 refills | Status: AC
Start: 2017-09-05 — End: ?

## 2017-09-05 NOTE — Progress Notes (Signed)
Preventative Health Care Updates   Since your last visit, please tell us if you have had any of the below services outside of [facility]:     Cervical screening/PAP: NO  If yes, location/date. Patient  Notified to schedule appointment     Mammo:  NO  If yes, location/date.    Colon Screen: NO  If yes, location/ date.    Specialty Care Updates  Have seen a specialist since your last visit: NO   If yes, Name, location and date.    Any forms to complete today? NO       HM Due:   FLU. PAP SMEAR       Health Maintenance   Topic Date Due    Cervical Cancer Screening (Pap Smear)  11/24/2016    Depression Screening (PHQ-2)  12/13/2016    Influenza Vaccine (1) 05/13/2017    Tetanus Vaccine  08/27/2022    HIV Screening  Completed           Future Appointments  Date Time Provider [place]   09/10/2017 9:00 AM Cindee Salt, MD [facility] STADIUM   01/03/2018 10:30 AM [facility] GEN ONC BLOOD DRAW S LAB [facility]   01/10/2018 11:15 AM Failor, Brayton Mars, MD S END3 [facility] WOMEN     Vaccine Screening Questions    Interpreter: No    1. Are you allergic to Latex? NO    2.  Have you had a serious reaction or an allergic reaction to a vaccine?  NO    3.  Currently have a moderate or severe illness, including fever?  NO    4.  Ever had a seizure or any neurological problem associated with a vaccine? (DTaP/TDaP/DTP pertinent) NO    5.  Is patient receiving any live vaccinations today? (Varicella-Chickenpox, MMR-Measles/Mumps/Rubella, Zoster-Shingles, Flumist, Yellow Fever) NOTE: oral rotavirus is exempt  NO    If YES to any of the questions above - Do NOT give vaccine.  Consult with RN or provider in clinic.  (#5 can be YES if all Live vaccine questions are answered NO)    If NO to all questions above - Patient may receive vaccine.    6.  Do you need to receive the Flu vaccine today? YES - Additional Flu Questions  Flu Vaccine Screening Questions:    Ever had a serious allergic reaction to eggs?  NO    Ever had  Guillain-Barre syndrome associated with a vaccine? NO    Less than 6 months old? NO    If YES to any of the Flu questions above - NO Flu Vaccine to be given.  Patient may consult provider as needed.    If NO to all questions above - Patient may receive Flu Shot (IM)    If between 6 months and 40 years of age, was flu vaccine received last year?  N/A  If NO to above question:   Children who are receiving influenza vaccine for the first time - administer 2 doses of the current influenza vaccine (separated by at least 4 weeks).          All patients are encouraged to wait 15 minutes before leaving after receiving any vaccine.    VIS given 09/05/2017 by Lilia Pro, MA.      Vaccine given today without initial adverse effect. YES    Rita Simpson

## 2017-09-05 NOTE — Progress Notes (Signed)
Rita Simpson is an 47 year old female who presents with the following concerns:    Chief Complaint   Patient presents with    Knee Pain     Patient fell two weeks ago Sunday. She fell while skiing brusing is better but still has pain.        HPI  [place] on ice while skiing 2 weeks ago, striking the medial and anterior part of her left knee. Had significant swelling and ecchymosis. Also has numbness across the skin of the patella anteriorly.  Has had improvement in swelling, pain, and ecchymosis, but continues to have pain with even light pressure across the knee, such as with clothing.  She continues to ski and denies pain with weightbearing, as long as there is no external pressure exerted on the knee.     She has had episodes of sharp popliteal knee pain since December, when she places her knee into full flexion.     She is due for thyroid check.  Is on levothyroxine for treatment of post-ablative hypothyroidism. She has history of unifocal papillary thyroid carcinoma, [id number], diagnosed in 2011. Has had negative surveillance scans since then. Was hyperthyroid at 176mcg/day and so her dose was reduced to 112 mcg/day. Denies fatigue or weight changes.     Review of Systems   Constitutional: Negative for chills, fatigue and fever.   Musculoskeletal:        See HPI above.    Neurological: Negative for weakness and numbness.       I personally reviewed and confirmed the ROS, past medical history, past surgical history and social history in the record with the patient today.    Medications were reviewed and updated in the chart today.    Allergies were reviewed and updated in the chart today as below:    Review of patient's allergies indicates:  No Known Allergies    OBJECTIVE:  BP 103/73    Pulse 83    Temp 98 F (36.7 C) (Temporal)    Resp 14    Ht 5\' 1"  (1.549 m)    Wt 116 lb 6.5 oz (52.8 kg)    LMP  (LMP Unknown)    SpO2 99%    BMI 21.99 kg/m     Physical Exam   Constitutional: She is oriented to person,  place, and time. She appears well-developed and well-nourished.   HENT:   Head: Normocephalic and atraumatic.   Eyes: Conjunctivae and EOM are normal.   Pulmonary/Chest: Effort normal.   Musculoskeletal:   Right knee has no swelling, effusion, tenderness, or ligament laxity.     Left knee has ecchymosis around medial tibial plateau. There is effusion and swelling inferior to joint line and patella. There is crepitus appreciated along inferior patella with knee flexion. There is no patellar tenderness to palpation. There is no ligament laxity. Negative McMurray.    Neurological: She is alert and oriented to person, place, and time.   Skin: Skin is warm and dry. No rash noted. No erythema.         ASSESSMENT/PLAN:  Contusion of left knee, initial encounter  Localized contusion with infrapatellar and pes anserine bursitis.  X-ray to r/o avulsion fractures. Reviewed steps to keep pressure off of the anterior of the knee. Activity as tolerated. Avoid activities that exacerbate the knee pain until feeling better. She is an avid skier so this is difficult for her to do.   - XR KNEE 3 VW LEFT; Future  Eczema, unspecified type  Refill of triamcinolone for flares of eczema  - Triamcinolone Acetonide 0.1 % External Ointment; Apply topically 2 times a day. To eczema for 2 weeks.  Dispense: 30 g; Refill: 3    Need for vaccination    - influenza vaccine quadrivalent PF (adult) 0.5 mL IM - [id number]    Postoperative hypothyroidism  Levothyroxine reduced from 165mcg/day to 122mcg/day in November due to hyperthyroid status. Dr Lenna Gilford her endocrinologist has target TSH of 0.1.   - THYROID STIMULATING HORMONE  - T4, FREE  - T3

## 2017-09-05 NOTE — Patient Instructions (Addendum)
Please stop at the lab for blood tests and x-ray.    If your x-ray does not show any evidence of fracture, then you can continue with activity as tolerated. Take steps to pad the knee and reduce direct pressure to the area.

## 2017-09-09 ENCOUNTER — Telehealth (INDEPENDENT_AMBULATORY_CARE_PROVIDER_SITE_OTHER): Payer: Self-pay | Admitting: Obstetrics & Gynecology

## 2017-09-09 NOTE — Telephone Encounter (Signed)
Please advise, see note from [facility]

## 2017-09-09 NOTE — Telephone Encounter (Signed)
(  TEXTING IS AN OPTION FOR [facility] CL[facility] ONLY)  Is this a [place] clinic? No      RETURN CALL: Detailed message on voicemail only [phone]      SUBJECT:  General Message     REASON FOR REQUEST: Pap schedule    MESSAGE: Patient would like to know if she is due for her pap smear yet or not. Please advise.    Thank you!

## 2017-09-10 ENCOUNTER — Encounter (HOSPITAL_BASED_OUTPATIENT_CLINIC_OR_DEPARTMENT_OTHER): Payer: No Typology Code available for payment source | Admitting: Physical Medicine & Rehabilitation

## 2017-09-13 NOTE — Telephone Encounter (Signed)
Called patient because she did not read eCare message sent on 1/28. She states that she is not sure of her pap hx. Sent ROI to get records from Dr. Jenny Reichmann McComb's office (last pap on file). Will get record to schedule patient for either pap smear of colpo depending on hx.

## 2017-09-16 ENCOUNTER — Other Ambulatory Visit (INDEPENDENT_AMBULATORY_CARE_PROVIDER_SITE_OTHER): Payer: Self-pay | Admitting: Physician Assistant

## 2017-09-16 DIAGNOSIS — E890 Postprocedural hypothyroidism: Secondary | ICD-10-CM

## 2017-09-17 ENCOUNTER — Ambulatory Visit (INDEPENDENT_AMBULATORY_CARE_PROVIDER_SITE_OTHER): Payer: No Typology Code available for payment source | Admitting: Physician Assistant

## 2017-11-29 ENCOUNTER — Other Ambulatory Visit (INDEPENDENT_AMBULATORY_CARE_PROVIDER_SITE_OTHER): Payer: Self-pay | Admitting: Physician Assistant

## 2017-11-29 DIAGNOSIS — E890 Postprocedural hypothyroidism: Secondary | ICD-10-CM

## 2017-12-02 MED ORDER — LEVOTHYROXINE SODIUM 112 MCG OR TABS
ORAL_TABLET | ORAL | 3 refills | Status: DC
Start: 2017-12-02 — End: 2019-12-29

## 2018-01-03 ENCOUNTER — Other Ambulatory Visit (HOSPITAL_BASED_OUTPATIENT_CLINIC_OR_DEPARTMENT_OTHER): Payer: No Typology Code available for payment source

## 2018-01-10 ENCOUNTER — Ambulatory Visit: Payer: No Typology Code available for payment source | Attending: Endocrinology | Admitting: Endocrinology

## 2018-01-10 ENCOUNTER — Ambulatory Visit (HOSPITAL_BASED_OUTPATIENT_CLINIC_OR_DEPARTMENT_OTHER): Payer: No Typology Code available for payment source

## 2018-01-10 ENCOUNTER — Encounter (HOSPITAL_BASED_OUTPATIENT_CLINIC_OR_DEPARTMENT_OTHER): Payer: Self-pay | Admitting: Endocrinology

## 2018-01-10 DIAGNOSIS — M25519 Pain in unspecified shoulder: Secondary | ICD-10-CM | POA: Insufficient documentation

## 2018-01-10 DIAGNOSIS — M542 Cervicalgia: Secondary | ICD-10-CM | POA: Insufficient documentation

## 2018-01-10 DIAGNOSIS — K5900 Constipation, unspecified: Secondary | ICD-10-CM | POA: Insufficient documentation

## 2018-01-10 DIAGNOSIS — M5489 Other dorsalgia: Secondary | ICD-10-CM | POA: Insufficient documentation

## 2018-01-10 DIAGNOSIS — C73 Malignant neoplasm of thyroid gland: Secondary | ICD-10-CM | POA: Insufficient documentation

## 2018-01-10 LAB — THYROID STIMULATING HORMONE: Thyroid Stimulating Hormone: 0.164 u[IU]/mL — ABNORMAL LOW (ref 0.400–5.000)

## 2018-01-10 LAB — BASIC METABOLIC PANEL
Anion Gap: 3 — ABNORMAL LOW (ref 4–12)
Calcium: 9.1 mg/dL (ref 8.9–10.2)
Carbon Dioxide, Total: 34 meq/L — ABNORMAL HIGH (ref 22–32)
Chloride: 103 meq/L (ref 98–108)
Creatinine: 0.64 mg/dL (ref 0.38–1.02)
GFR, Calc, African American: >60 mL/min/{1.73_m2} (ref >59)
GFR, Calc, European American: >60 mL/min/{1.73_m2} (ref >59)
Glucose: 92 mg/dL (ref 62–125)
Potassium: 3.7 meq/L (ref 3.6–5.2)
Sodium: 140 meq/L (ref 135–145)
Urea Nitrogen: 13 mg/dL (ref 8–21)

## 2018-01-10 LAB — T4, FREE: Thyroxine (Free): 1.4 ng/dL — ABNORMAL HIGH (ref 0.6–1.2)

## 2018-01-13 LAB — THYROGLOBULIN AND ANTIBODY PANEL (THYROID CANCER RECURRENCE)
Thyroglobulin Antibody: <1 [IU]/mL (ref 0.0–1.9)
Thyroglobulin: <0.1 ng/mL (ref 0.0–35.0)

## 2018-09-15 NOTE — Progress Notes (Signed)
Department: Population Health Management  Insurer: [employer]   Care Gaps due: Cervical cancer screening     No patient outreach needed based on thorough chart review. Chart Review indicates PT appt is postponed based on findings that patient is an oncology patient.To schedule or if there are any questions, patient can call the [place] team at [phone].     Closing Encounter.

## 2018-09-22 ENCOUNTER — Telehealth (INDEPENDENT_AMBULATORY_CARE_PROVIDER_SITE_OTHER): Payer: Self-pay

## 2018-09-22 NOTE — Telephone Encounter (Addendum)
RETURN CALL: Voicemail - General Message      SUBJECT:  Medication Questions     NAME OF MEDICATION(S): Levothyroxine Sodium 112 MCG  CONCERNS/QUESTIONS: Need to clarify instructions  ADDITIONAL INFORMATION: Please call Santiago Glad at [employer] at [phone]. Ref# [id number]    Thank you

## 2018-09-23 NOTE — Telephone Encounter (Signed)
Returned call to pharmacy.  Advised that we have not written this Rx since 12/02/17. Confirmed that their questions are for another provider.  They will follow up with correct provider.     Closing TE - no further action needed.

## 2018-10-24 ENCOUNTER — Encounter (INDEPENDENT_AMBULATORY_CARE_PROVIDER_SITE_OTHER): Payer: Self-pay

## 2018-10-29 NOTE — Telephone Encounter (Signed)
Spoke with the patient and advised her of the [place] sent eCare message had not been read

## 2018-10-30 ENCOUNTER — Telehealth (INDEPENDENT_AMBULATORY_CARE_PROVIDER_SITE_OTHER): Payer: Self-pay | Admitting: Obstetrics & Gynecology

## 2018-10-30 ENCOUNTER — Encounter (INDEPENDENT_AMBULATORY_CARE_PROVIDER_SITE_OTHER): Payer: No Typology Code available for payment source | Admitting: Obstetrics & Gynecology

## 2018-10-30 DIAGNOSIS — Z975 Presence of (intrauterine) contraceptive device: Secondary | ICD-10-CM | POA: Insufficient documentation

## 2018-10-30 NOTE — Telephone Encounter (Signed)
Rita Simpson has an appointment to remove her Mirena and replace it.  It was placed in April 2015.  Off label use of Mirena can provide excellent contraception for 7 years.  In view of the coronavirus situation in [place] at this time, I have recommended that she reschedule her appointment.  She agrees.

## 2019-12-21 ENCOUNTER — Other Ambulatory Visit (HOSPITAL_BASED_OUTPATIENT_CLINIC_OR_DEPARTMENT_OTHER): Payer: Self-pay | Admitting: Endocrinology

## 2019-12-21 DIAGNOSIS — E890 Postprocedural hypothyroidism: Secondary | ICD-10-CM

## 2019-12-29 ENCOUNTER — Telehealth (HOSPITAL_BASED_OUTPATIENT_CLINIC_OR_DEPARTMENT_OTHER): Payer: Self-pay

## 2019-12-29 ENCOUNTER — Other Ambulatory Visit (HOSPITAL_BASED_OUTPATIENT_CLINIC_OR_DEPARTMENT_OTHER): Payer: Self-pay | Admitting: Endocrinology

## 2019-12-29 DIAGNOSIS — E890 Postprocedural hypothyroidism: Secondary | ICD-10-CM

## 2019-12-29 DIAGNOSIS — C73 Malignant neoplasm of thyroid gland: Secondary | ICD-10-CM | POA: Insufficient documentation

## 2019-12-29 MED ORDER — LEVOTHYROXINE SODIUM 112 MCG OR TABS
ORAL_TABLET | ORAL | 3 refills | Status: DC
Start: 2019-12-29 — End: 2020-12-12

## 2019-12-29 NOTE — Telephone Encounter (Signed)
Hi Dr. Lenna Gilford-  Rita Simpson is needing a refill for levothyroxine 112 mcg tab.  Will you please refill?  I looks like she has not been see for 2 years.  Do you want to place orders for follow up?  Thanks

## 2020-06-25 ENCOUNTER — Other Ambulatory Visit (HOSPITAL_BASED_OUTPATIENT_CLINIC_OR_DEPARTMENT_OTHER): Payer: No Typology Code available for payment source

## 2020-12-10 ENCOUNTER — Other Ambulatory Visit (HOSPITAL_BASED_OUTPATIENT_CLINIC_OR_DEPARTMENT_OTHER): Payer: Self-pay | Admitting: Endocrinology

## 2020-12-10 DIAGNOSIS — E890 Postprocedural hypothyroidism: Secondary | ICD-10-CM

## 2020-12-12 ENCOUNTER — Telehealth (HOSPITAL_BASED_OUTPATIENT_CLINIC_OR_DEPARTMENT_OTHER): Payer: Self-pay

## 2020-12-12 DIAGNOSIS — E890 Postprocedural hypothyroidism: Secondary | ICD-10-CM

## 2020-12-12 MED ORDER — LEVOTHYROXINE SODIUM 112 MCG OR TABS
ORAL_TABLET | ORAL | 3 refills | Status: DC
Start: 2020-12-12 — End: 2021-09-28

## 2020-12-12 NOTE — Telephone Encounter (Signed)
[id number]

## 2020-12-13 NOTE — Telephone Encounter (Signed)
levothyroxine 112 MCG tablet     The original prescription was reordered on 12/12/2020 by Tiburcio Pea, MD. Renewing this prescription may not be appropriate.

## 2021-09-28 ENCOUNTER — Other Ambulatory Visit (HOSPITAL_BASED_OUTPATIENT_CLINIC_OR_DEPARTMENT_OTHER): Payer: Self-pay | Admitting: Endocrinology

## 2021-09-28 DIAGNOSIS — E890 Postprocedural hypothyroidism: Secondary | ICD-10-CM

## 2021-09-28 MED ORDER — LEVOTHYROXINE SODIUM 112 MCG OR TABS
ORAL_TABLET | ORAL | 3 refills | Status: DC
Start: 2021-09-28 — End: 2021-11-03

## 2021-09-28 NOTE — Telephone Encounter (Signed)
Refill request

## 2021-10-10 ENCOUNTER — Other Ambulatory Visit (HOSPITAL_BASED_OUTPATIENT_CLINIC_OR_DEPARTMENT_OTHER): Payer: Self-pay | Admitting: Endocrinology

## 2021-10-10 DIAGNOSIS — C73 Malignant neoplasm of thyroid gland: Secondary | ICD-10-CM

## 2021-10-10 DIAGNOSIS — E890 Postprocedural hypothyroidism: Secondary | ICD-10-CM

## 2021-10-24 ENCOUNTER — Ambulatory Visit: Payer: No Typology Code available for payment source | Attending: Endocrinology

## 2021-10-24 DIAGNOSIS — C73 Malignant neoplasm of thyroid gland: Secondary | ICD-10-CM

## 2021-10-24 DIAGNOSIS — E890 Postprocedural hypothyroidism: Secondary | ICD-10-CM | POA: Insufficient documentation

## 2021-10-24 LAB — BASIC METABOLIC PANEL
Anion Gap: 7 (ref 4–12)
Calcium: 8.8 mg/dL — ABNORMAL LOW (ref 8.9–10.2)
Carbon Dioxide, Total: 31 meq/L (ref 22–32)
Chloride: 101 meq/L (ref 98–108)
Creatinine: 0.61 mg/dL (ref 0.38–1.02)
Glucose: 84 mg/dL (ref 62–125)
Potassium: 3.7 meq/L (ref 3.6–5.2)
Sodium: 139 meq/L (ref 135–145)
Urea Nitrogen: 13 mg/dL (ref 8–21)
eGFR by CKD-EPI 2021: >60 mL/min/{1.73_m2} (ref >59)

## 2021-10-24 LAB — T4, FREE: Thyroxine (Free): 1.5 ng/dL — ABNORMAL HIGH (ref 0.6–1.2)

## 2021-10-24 LAB — THYROGLOBULIN AND ANTIBODY PANEL (THYROID CANCER RECURRENCE)
Thyroglobulin Antibody: <1 [IU]/mL (ref 0.0–1.9)
Thyroglobulin: <0.1 ng/mL (ref 0.0–35.0)

## 2021-10-24 LAB — THYROID STIMULATING HORMONE: Thyroid Stimulating Hormone: 0.393 u[IU]/mL — ABNORMAL LOW (ref 0.400–5.000)

## 2021-11-03 ENCOUNTER — Ambulatory Visit: Payer: No Typology Code available for payment source | Attending: Endocrinology | Admitting: Endocrinology

## 2021-11-03 DIAGNOSIS — C73 Malignant neoplasm of thyroid gland: Secondary | ICD-10-CM | POA: Insufficient documentation

## 2021-11-03 DIAGNOSIS — E890 Postprocedural hypothyroidism: Secondary | ICD-10-CM | POA: Insufficient documentation

## 2021-11-03 MED ORDER — LEVOTHYROXINE SODIUM 100 MCG OR TABS
100.0000 ug | ORAL_TABLET | Freq: Every day | ORAL | 3 refills | Status: DC
Start: 2021-11-03 — End: 2022-11-12

## 2021-11-03 MED ORDER — TRAZODONE HCL 50 MG OR TABS
50.0000 mg | ORAL_TABLET | Freq: Every evening | ORAL | 5 refills | Status: AC
Start: 2021-11-03 — End: ?

## 2021-11-03 NOTE — Progress Notes (Signed)
[facility] Progress Note     Patient Name: Rita Simpson, Rita Simpson.  Date of Service: November 03, 2021    Patient ID: [id number] Date of Birth: October 24, 1970    Clinician: Tiburcio Pea, MD Facility: [facility]   Location: [id number]         HISTORY OF PRESENT ILLNESS:  Rita Simpson is a very pleasant 51 year old woman who I have not seen since 2019.  Some of this was inadvertent and some of it was related to the pandemic, but she is back just feeling it was time, even though nothing has changed for the worse as of recent.     She says about a year and a half ago, she figured she went through menopause, had some labs done that confirmed this in her report.  I do not have access to those labs.  She says that she has some flushing.  She feels a little bit warm.  She does have episodes where she feels too warm and she sweats more than she used to.  She has stopped having menses.  Her TSH is actually minimally suppressed with a somewhat high free T4 at 1.5 and the TSH 0.393. This might be partly be related to the decreased estrogen effect on thyroid binding globulin.  We have discussed that.  Otherwise, she feels quite well.  She hikes regularly.  She says she is amazed at how good she feels in general.  She does not sleep very well; this has been longstanding.  Bowel habits are fine.  No change in hair, skin, no palpitations or tremors.  She says otherwise, she has been quite healthy.     MEDICATIONS:  The only medication she is taking is levothyroxine 112 mcg a day.     PHYSICAL EXAMINATION:  GENERAL:  She appears well today.  VITAL SIGNS:  Her weight is 54.8 kilograms.  Her blood pressure is 135/82. Her pulse is 70.  HEENT:  Unremarkable.  NECK:  There is no palpable thyroid tissue or adenopathy.  CHEST:  Clear.  CARDIAC:  Regular rhythm.  No murmurs or gallops.  ABDOMEN:  Benign.  EXTREMITIES:  There is no edema, no tremor.     IMPRESSION AND PLAN:  We have discussed and agreed to decrease her thyroid hormone to 100 mcg a day and  recheck levels in two months' time.  We have also discussed the fact that if she feels significantly worse, she will contact me.  I think if anything, she will notice an improvement in terms of the sense of being too warm, the sweating and possibly the sleep difficulties.  She has used trazodone in the past and this worked quite well for her.  She would like to try it again and therefore, we have agreed. We will send a prescription as well as for the 100 mcg.  We will plan on seeing her back in one year's time.                                                                             Tiburcio Pea, MD      Date Dictated: 11/03/2021    Date Transcribed: 11/03/2021    RAF/rk  Job #: [id number]

## 2022-01-11 ENCOUNTER — Ambulatory Visit: Payer: No Typology Code available for payment source | Attending: Endocrinology

## 2022-01-11 DIAGNOSIS — E890 Postprocedural hypothyroidism: Secondary | ICD-10-CM | POA: Insufficient documentation

## 2022-01-11 DIAGNOSIS — C73 Malignant neoplasm of thyroid gland: Secondary | ICD-10-CM | POA: Insufficient documentation

## 2022-01-11 LAB — THYROID STIMULATING HORMONE: Thyroid Stimulating Hormone: 1.119 u[IU]/mL (ref 0.400–5.000)

## 2022-01-11 LAB — T4, FREE: Thyroxine (Free): 1.3 ng/dL — ABNORMAL HIGH (ref 0.6–1.2)

## 2022-01-12 NOTE — Result Encounter Note (Signed)
Hi Caral-            The TSH is now normal. Please continue the same dose of thyroid hormone. Please let me know if you have questions.      Kenyon Ana M.D.

## 2022-05-18 ENCOUNTER — Encounter (HOSPITAL_BASED_OUTPATIENT_CLINIC_OR_DEPARTMENT_OTHER): Payer: Self-pay

## 2022-09-26 ENCOUNTER — Institutional Professional Consult (permissible substitution) (INDEPENDENT_AMBULATORY_CARE_PROVIDER_SITE_OTHER): Payer: No Typology Code available for payment source | Admitting: Internal Medicine

## 2022-09-27 ENCOUNTER — Encounter (HOSPITAL_BASED_OUTPATIENT_CLINIC_OR_DEPARTMENT_OTHER): Payer: Self-pay

## 2022-09-28 ENCOUNTER — Institutional Professional Consult (permissible substitution) (INDEPENDENT_AMBULATORY_CARE_PROVIDER_SITE_OTHER): Payer: No Typology Code available for payment source | Admitting: Internal Medicine

## 2022-11-12 ENCOUNTER — Other Ambulatory Visit (HOSPITAL_BASED_OUTPATIENT_CLINIC_OR_DEPARTMENT_OTHER): Payer: Self-pay | Admitting: Endocrinology

## 2022-11-12 DIAGNOSIS — E890 Postprocedural hypothyroidism: Secondary | ICD-10-CM

## 2022-11-12 MED ORDER — LEVOTHYROXINE SODIUM 100 MCG OR TABS
100.0000 ug | ORAL_TABLET | Freq: Every day | ORAL | 3 refills | Status: DC
Start: 2022-11-12 — End: 2023-07-22

## 2022-11-12 NOTE — Telephone Encounter (Signed)
Refill request

## 2023-04-22 ENCOUNTER — Other Ambulatory Visit: Payer: Self-pay

## 2023-04-25 ENCOUNTER — Ambulatory Visit (INDEPENDENT_AMBULATORY_CARE_PROVIDER_SITE_OTHER): Payer: No Typology Code available for payment source | Admitting: General Practice

## 2023-05-27 ENCOUNTER — Other Ambulatory Visit: Payer: Self-pay

## 2023-07-22 ENCOUNTER — Telehealth (HOSPITAL_BASED_OUTPATIENT_CLINIC_OR_DEPARTMENT_OTHER): Payer: Self-pay

## 2023-07-22 ENCOUNTER — Other Ambulatory Visit (HOSPITAL_BASED_OUTPATIENT_CLINIC_OR_DEPARTMENT_OTHER): Payer: Self-pay | Admitting: Endocrinology

## 2023-07-22 DIAGNOSIS — E890 Postprocedural hypothyroidism: Secondary | ICD-10-CM

## 2023-07-22 DIAGNOSIS — Z8632 Personal history of gestational diabetes: Secondary | ICD-10-CM

## 2023-07-22 DIAGNOSIS — Z85850 Personal history of malignant neoplasm of thyroid: Secondary | ICD-10-CM

## 2023-07-22 MED ORDER — LEVOTHYROXINE SODIUM 100 MCG OR TABS: 100.0000 ug | ORAL_TABLET | Freq: Every day | ORAL | 3 refills | Status: DC

## 2023-07-22 NOTE — Telephone Encounter (Signed)
Refill request

## 2023-10-11 ENCOUNTER — Encounter (HOSPITAL_BASED_OUTPATIENT_CLINIC_OR_DEPARTMENT_OTHER): Payer: Self-pay | Admitting: Endocrinology

## 2023-10-11 ENCOUNTER — Ambulatory Visit (HOSPITAL_BASED_OUTPATIENT_CLINIC_OR_DEPARTMENT_OTHER): Payer: No Typology Code available for payment source

## 2023-10-11 ENCOUNTER — Ambulatory Visit: Payer: No Typology Code available for payment source | Attending: Endocrinology | Admitting: Endocrinology

## 2023-10-11 VITALS — BP 149/91 | HR 75 | Temp 98.1°F | Resp 16 | Wt 123.2 lb

## 2023-10-11 DIAGNOSIS — R739 Hyperglycemia, unspecified: Secondary | ICD-10-CM | POA: Insufficient documentation

## 2023-10-11 DIAGNOSIS — Z85850 Personal history of malignant neoplasm of thyroid: Secondary | ICD-10-CM

## 2023-10-11 DIAGNOSIS — E890 Postprocedural hypothyroidism: Secondary | ICD-10-CM

## 2023-10-11 DIAGNOSIS — J4520 Mild intermittent asthma, uncomplicated: Secondary | ICD-10-CM | POA: Insufficient documentation

## 2023-10-11 DIAGNOSIS — Z8632 Personal history of gestational diabetes: Secondary | ICD-10-CM

## 2023-10-11 LAB — THYROID STIMULATING HORMONE: Thyroid Stimulating Hormone: 1.381 u[IU]/mL (ref 0.400–5.000)

## 2023-10-11 LAB — BASIC METABOLIC PANEL
Anion Gap: 3 — ABNORMAL LOW (ref 4–12)
Calcium: 8.9 mg/dL (ref 8.9–10.2)
Carbon Dioxide, Total: 33 meq/L — ABNORMAL HIGH (ref 22–32)
Chloride: 104 meq/L (ref 98–108)
Creatinine: 0.73 mg/dL (ref 0.38–1.02)
Glucose: 98 mg/dL (ref 62–125)
Potassium: 3.8 meq/L (ref 3.6–5.2)
Sodium: 140 meq/L (ref 135–145)
Urea Nitrogen: 19 mg/dL (ref 8–21)
eGFR by CKD-EPI 2021: >60 mL/min/{1.73_m2} (ref >59)

## 2023-10-11 LAB — T4, FREE: Thyroxine (Free): 1.1 ng/dL (ref 0.6–1.2)

## 2023-10-11 MED ORDER — ALBUTEROL SULFATE HFA 108 (90 BASE) MCG/ACT IN AERS
2.0000 | INHALATION_SPRAY | Freq: Four times a day (QID) | RESPIRATORY_TRACT | 3 refills | Status: AC | PRN
Start: 2023-10-11 — End: ?

## 2023-10-11 NOTE — Progress Notes (Signed)
 [facility] Progress Note     Patient Name: JOYLEEN, Rita Simpson.  Date of Service: October 11, 2023    Patient ID: [id number] Date of Birth: 03/12/1971    Clinician: Amadeo Garnet, MD Facility: [facility]   Location: [phone]         ENDOCRINE NEOPLASIA CLINIC      Ahmani is a very pleasant 53 year old woman who returns today for followup.  She has a history of a papillary thyroid cancer initially diagnosed in 2011, as an oncocytic variant papillary thyroid cancer, a T2 NX.  She has done very well since that time and has had repeatedly undetectable thyroglobulins.  She has one that was drawn today that is currently pending.     When I last saw her, we decreased her dose of thyroid hormone as her TSH was overly suppressed.  The last time I saw her was in 2023.     She has done quite well since that time.  Recently hiked up [employer] [employer] and got altitude sickness and was having problems, but otherwise is doing fine.     Her initial surgery was for a 2.5 unifocal papillary thyroid cancer, oncocytic variant.  Margins were negative, a T2, NX lesion.  She had 109 mCi of I-131 post-surgery and then the postsurgical scan showed uptake in the thyroid bed only.  As noted since then, she has been fine.  We are not suppressing her thyroglobulins.  We are not doing imaging.     She recently figured out that she really does have asthma, which she says she has been told before, but never really quite believed it, but with the hike on [place], she definitely decided that she does.     She is having menopausal symptoms, but no palpitations or tremors.  Her weight is stable.  Bowel habits are fine.     MEDICATIONS:    Only medication she is taking besides albuterol inhaler is levothyroxine 100 mcg a day.     PHYSICAL EXAMINATION:       GENERAL:    She appears well today.       VITAL SIGNS:    Her weight is 55.9 kilograms.  Her blood pressure initially was 149/91, upon my repeat I get 126/82.     HEENT:    Unremarkable.     NECK:    No  palpable thyroid tissue or adenopathy in her neck.     CHEST:    Clear.     CARDIAC:    Regular rhythm.  No murmurs or gallops.     ABDOMEN:    Benign.     EXTREMITIES:    No edema, no tremor.     IMPRESSION AND PLAN:    I will get back to her with the labs, but seems to be doing well.  At her request, I have refilled her albuterol inhaler.  We will plan on followup in a year, unless there is some reason to consider a different plan based upon her labs.                                                                             Amadeo Garnet, MD  Date Dictated: 10/11/2023 2:36:34 PM    Date Transcribed: 10/11/2023    RAF/sc   Job #: [id number]

## 2023-10-12 LAB — HEMOGLOBIN A1C, HPLC: Hemoglobin A1C: 4.9 % (ref 4.0–5.6)

## 2023-10-14 LAB — THYROGLOBULIN AND ANTIBODY PANEL (THYROID CANCER RECURRENCE)
Thyroglobulin Antibody: <1 [IU]/mL (ref 0.0–1.9)
Thyroglobulin: <0.1 ng/mL (ref 0.0–35.0)

## 2023-10-15 NOTE — Result Encounter Note (Signed)
 Hi Nilza-          The thyroglobulin is undetectable, and the thyroid hormone levels are in the desired range. The A1C is mid range normal, and the electrolytes are also normal.        Good to see you. Please let me know if you have questions.      Alesia Banda M.D.

## 2024-04-29 ENCOUNTER — Encounter (HOSPITAL_BASED_OUTPATIENT_CLINIC_OR_DEPARTMENT_OTHER): Payer: Self-pay

## 2024-07-25 ENCOUNTER — Other Ambulatory Visit (HOSPITAL_BASED_OUTPATIENT_CLINIC_OR_DEPARTMENT_OTHER): Payer: Self-pay | Admitting: Endocrinology

## 2024-07-25 DIAGNOSIS — E890 Postprocedural hypothyroidism: Secondary | ICD-10-CM

## 2024-07-27 MED ORDER — LEVOTHYROXINE SODIUM 100 MCG OR TABS: ORAL_TABLET | ORAL | 3 refills | Status: AC

## 2024-07-27 NOTE — Telephone Encounter (Signed)
 Refill request

## 2024-09-14 ENCOUNTER — Encounter (HOSPITAL_BASED_OUTPATIENT_CLINIC_OR_DEPARTMENT_OTHER): Payer: Self-pay
